# Patient Record
Sex: Male | Born: 1990
Health system: Southern US, Community
[De-identification: ages and names within clinical notes are randomized; demographics above are authoritative.]

## PROBLEM LIST (undated history)

## (undated) DIAGNOSIS — K219 Gastro-esophageal reflux disease without esophagitis: Secondary | ICD-10-CM

---
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
## 2011-04-08 ENCOUNTER — Ambulatory Visit: Payer: Self-pay | Admitting: Internal Medicine

## 2011-04-21 ENCOUNTER — Ambulatory Visit: Payer: Self-pay | Admitting: Internal Medicine

## 2011-08-30 ENCOUNTER — Emergency Department: Payer: Self-pay | Admitting: Emergency Medicine

## 2011-10-31 ENCOUNTER — Ambulatory Visit: Payer: Self-pay | Admitting: Internal Medicine

## 2013-07-09 ENCOUNTER — Emergency Department: Payer: Self-pay | Admitting: Emergency Medicine

## 2013-07-09 LAB — CBC
HCT: 49.6 % (ref 40.0–52.0)
HGB: 16.9 g/dL (ref 13.0–18.0)
MCH: 28.6 pg (ref 26.0–34.0)
MCHC: 34 g/dL (ref 32.0–36.0)
MCV: 84 fL (ref 80–100)
Platelet: 236 10*3/uL (ref 150–440)
RBC: 5.89 10*6/uL (ref 4.40–5.90)
RDW: 12.8 % (ref 11.5–14.5)
WBC: 6.6 10*3/uL (ref 3.8–10.6)

## 2013-07-09 LAB — URINALYSIS, COMPLETE
Bacteria: NONE SEEN
Bilirubin,UR: NEGATIVE
Blood: NEGATIVE
Glucose,UR: NEGATIVE mg/dL (ref 0–75)
Ketone: NEGATIVE
Leukocyte Esterase: NEGATIVE
Nitrite: NEGATIVE
Ph: 5 (ref 4.5–8.0)
Protein: 30
RBC,UR: 1 /HPF (ref 0–5)
Specific Gravity: 1.033 (ref 1.003–1.030)
Squamous Epithelial: 1
WBC UR: 2 /HPF (ref 0–5)

## 2013-07-09 LAB — COMPREHENSIVE METABOLIC PANEL
Albumin: 4.1 g/dL (ref 3.4–5.0)
Alkaline Phosphatase: 81 U/L
Anion Gap: 3 — ABNORMAL LOW (ref 7–16)
BUN: 18 mg/dL (ref 7–18)
Bilirubin,Total: 0.9 mg/dL (ref 0.2–1.0)
Calcium, Total: 9 mg/dL (ref 8.5–10.1)
Chloride: 104 mmol/L (ref 98–107)
Co2: 29 mmol/L (ref 21–32)
Creatinine: 1 mg/dL (ref 0.60–1.30)
EGFR (African American): >60
EGFR (Non-African Amer.): >60
Glucose: 97 mg/dL (ref 65–99)
Osmolality: 274 (ref 275–301)
Potassium: 4.1 mmol/L (ref 3.5–5.1)
SGOT(AST): 22 U/L (ref 15–37)
SGPT (ALT): 19 U/L (ref 12–78)
Sodium: 136 mmol/L (ref 136–145)
Total Protein: 7.5 g/dL (ref 6.4–8.2)

## 2013-07-09 LAB — LIPASE, BLOOD: Lipase: 106 U/L (ref 73–393)

## 2013-07-27 ENCOUNTER — Emergency Department: Payer: Self-pay | Admitting: Internal Medicine

## 2013-11-28 ENCOUNTER — Emergency Department: Payer: Self-pay | Admitting: Emergency Medicine

## 2013-11-30 LAB — BETA STREP CULTURE(ARMC)

## 2014-10-24 ENCOUNTER — Emergency Department
Admission: EM | Admit: 2014-10-24 | Discharge: 2014-10-24 | Disposition: A | Discharge status: Home / Self Care | Payer: Self-pay | Attending: Emergency Medicine | Admitting: Emergency Medicine

## 2014-10-24 ENCOUNTER — Encounter: Payer: Self-pay | Admitting: Medical Oncology

## 2014-10-24 DIAGNOSIS — M62838 Other muscle spasm: Secondary | ICD-10-CM

## 2014-10-24 DIAGNOSIS — M62830 Muscle spasm of back: Secondary | ICD-10-CM | POA: Insufficient documentation

## 2014-10-24 DIAGNOSIS — R079 Chest pain, unspecified: Secondary | ICD-10-CM | POA: Insufficient documentation

## 2014-10-24 LAB — BASIC METABOLIC PANEL
Anion gap: 7 (ref 5–15)
BUN: 12 mg/dL (ref 6–20)
CO2: 30 mmol/L (ref 22–32)
Calcium: 9.1 mg/dL (ref 8.9–10.3)
Chloride: 105 mmol/L (ref 101–111)
Creatinine, Ser: 0.93 mg/dL (ref 0.61–1.24)
GFR calc Af Amer: >60 mL/min (ref >60)
GFR calc non Af Amer: >60 mL/min (ref >60)
Glucose, Bld: 82 mg/dL (ref 65–99)
Potassium: 4 mmol/L (ref 3.5–5.1)
Sodium: 142 mmol/L (ref 135–145)

## 2014-10-24 LAB — CBC
HCT: 46 % (ref 40.0–52.0)
Hemoglobin: 15.3 g/dL (ref 13.0–18.0)
MCH: 27.9 pg (ref 26.0–34.0)
MCHC: 33.3 g/dL (ref 32.0–36.0)
MCV: 83.8 fL (ref 80.0–100.0)
Platelets: 242 10*3/uL (ref 150–440)
RBC: 5.48 MIL/uL (ref 4.40–5.90)
RDW: 12.9 % (ref 11.5–14.5)
WBC: 10.1 10*3/uL (ref 3.8–10.6)

## 2014-10-24 MED ORDER — IBUPROFEN 800 MG PO TABS: 800.0000 mg | ORAL_TABLET | Freq: Three times a day (TID) | ORAL | Status: DC | PRN

## 2014-10-24 MED ORDER — IBUPROFEN 800 MG PO TABS
800.0000 mg | ORAL_TABLET | Freq: Once | ORAL | Status: AC
  Administered 2014-10-24: 800 mg via ORAL

## 2014-10-24 MED ORDER — IBUPROFEN 800 MG PO TABS
ORAL_TABLET | ORAL | Status: AC
  Administered 2014-10-24: 800 mg via ORAL
  Filled 2014-10-24: qty 1

## 2014-10-24 NOTE — ED Provider Notes (Signed)
[facility] Emergency Department Provider Note    ____________________________________________  Time seen: 2020  I have reviewed the triage vital signs and the nursing notes.   HISTORY  Chief Complaint Chest Pain and Back Pain   History limited by: Not Limited   HPI Karl Johnson is a 24 y.o. male resents to the emergency department because of concerns for back and chest pain. The patient states that he started developing some back pain yesterday. Today he also developed some right chest pain. He states the back pain did radiate down into his legs. He denies any recent trauma to his chest or back. Additionally he states he has had slightly similar symptoms in the past but not quite exactly like this. Denies any fevers or shortness of breath.     History reviewed. No pertinent past medical history.  There are no active problems to display for this patient.   History reviewed. No pertinent past surgical history.  No current outpatient prescriptions on file.  Allergies Review of patient's allergies indicates no known allergies.  No family history on file.  Social History History  Substance Use Topics  . Smoking status: Never Smoker   . Smokeless tobacco: Not on file  . Alcohol Use: No    Review of Systems  Constitutional: Negative for fever. Cardiovascular: Right-sided chest pain. Respiratory: Negative for shortness of breath. Gastrointestinal: Negative for abdominal pain, vomiting and diarrhea. Genitourinary: Negative for dysuria. Musculoskeletal: Negative for back pain. Skin: Negative for rash. Neurological: Negative for headaches, focal weakness or numbness.   10-point ROS otherwise negative.  ____________________________________________   PHYSICAL EXAM:  VITAL SIGNS: ED Triage Vitals  Enc Vitals Group     BP 10/24/14 1741 115/88 mmHg     Pulse Rate 10/24/14 1741 93     Resp 10/24/14 1741 18     Temp 10/24/14 1741 98.3 F  (36.8 C)     Temp Source 10/24/14 1741 Oral     SpO2 10/24/14 1741 98 %     Weight 10/24/14 1741 171 lb (77.565 kg)     Height 10/24/14 1741 6\' 2"  (1.88 m)     Head Cir --      Peak Flow --      Pain Score 10/24/14 1741 6   Constitutional: Alert and oriented. Well appearing and in no distress. Eyes: Conjunctivae are normal. PERRL. Normal extraocular movements. ENT   Head: Normocephalic and atraumatic.   Nose: No congestion/rhinnorhea.   Mouth/Throat: Mucous membranes are moist.   Neck: No stridor. Hematological/Lymphatic/Immunilogical: No cervical lymphadenopathy. Cardiovascular: Normal rate, regular rhythm.  No murmurs, rubs, or gallops. Respiratory: Normal respiratory effort without tachypnea nor retractions. Breath sounds are clear and equal bilaterally. No wheezes/rales/rhonchi. Gastrointestinal: Soft and nontender. No distention. There is no CVA tenderness. Genitourinary: Deferred Musculoskeletal: Normal range of motion in all extremities. No joint effusions.  No lower extremity tenderness nor edema. No vertebral tenderness Neurologic:  Normal speech and language. No gross focal neurologic deficits are appreciated. Speech is normal.  Skin:  Skin is warm, dry and intact. No rash noted. Psychiatric: Mood and affect are normal. Speech and behavior are normal. Patient exhibits appropriate insight and judgment.  ____________________________________________    LABS (pertinent positives/negatives)  Labs Reviewed  CBC  BASIC METABOLIC PANEL     ____________________________________________   EKG  EKG Time: 1750 Rate: 90 Rhythm: Normal sinus rhythm Axis: Normal Intervals: QTc 411 QRS: Normal ST changes: No ST elevation    ____________________________________________    RADIOLOGY  None  ____________________________________________   PROCEDURES  Procedure(s) performed: None  Critical Care performed:  No  ____________________________________________   INITIAL IMPRESSION / ASSESSMENT AND PLAN / ED COURSE  Pertinent labs & imaging results that were available during my care of the patient were reviewed by me and considered in my medical decision making (see chart for details).  Patient here with some back and chest pain. Physical exam is very reassuring. No respiratory distress. Breath sounds equal clear bilaterally. Basic blood work without any concerning findings, additionally EKG without any concerning findings. Think this likely represents some chest wall inflammation or muscle spasms. I'll give patient high-dose ibuprofen.  ____________________________________________   FINAL CLINICAL IMPRESSION(S) / ED DIAGNOSES  Final diagnoses:  Muscle spasm     Phineas SemenGraydon Kin Galbraith, MD 10/24/14 2129

## 2014-10-24 NOTE — ED Notes (Signed)
Pt reports that he began having rt lower back pain that radiates into leg. Today while at work began having rt sided chest pain.

## 2014-10-24 NOTE — Discharge Instructions (Signed)
Please seek medical attention for any high fevers, chest pain, shortness of breath, change in behavior, persistent vomiting, bloody stool or any other new or concerning symptoms.  Muscle Cramps and Spasms Muscle cramps and spasms are when muscles tighten by themselves. They usually get better within minutes. Muscle cramps are painful. They are usually stronger and last longer than muscle spasms. Muscle spasms may or may not be painful. They can last a few seconds or much longer. HOME CARE  Drink enough fluid to keep your pee (urine) clear or pale yellow.  Massage, stretch, and relax the muscle.  Use a warm towel, heating pad, or warm shower water on tight muscles.  Place ice on the muscle if it is tender or in pain.  Put ice in a plastic bag.  Place a towel between your skin and the bag.  Leave the ice on for 15-20 minutes, 03-04 times a day.  Only take medicine as told by your doctor. GET HELP RIGHT AWAY IF:  Your cramps or spasms get worse, happen more often, or do not get better with time. MAKE SURE YOU:  Understand these instructions.  Will watch your condition.  Will get help right away if you are not doing well or get worse. Document Released: 05/15/2008 Document Revised: 09/27/2012 Document Reviewed: 05/19/2012 [facility] Patient Information 2015 [employer] [place]. This information is not intended to replace advice given to you by your health care provider. Make sure you discuss any questions you have with your health care provider.

## 2015-03-08 ENCOUNTER — Encounter: Payer: Self-pay | Admitting: Emergency Medicine

## 2015-03-08 ENCOUNTER — Emergency Department
Admission: EM | Admit: 2015-03-08 | Discharge: 2015-03-08 | Disposition: A | Discharge status: Home / Self Care | Payer: PRIVATE HEALTH INSURANCE | Attending: Emergency Medicine | Admitting: Emergency Medicine

## 2015-03-08 DIAGNOSIS — B349 Viral infection, unspecified: Secondary | ICD-10-CM | POA: Diagnosis not present

## 2015-03-08 DIAGNOSIS — Z79899 Other long term (current) drug therapy: Secondary | ICD-10-CM | POA: Diagnosis not present

## 2015-03-08 DIAGNOSIS — M791 Myalgia: Secondary | ICD-10-CM | POA: Diagnosis present

## 2015-03-08 LAB — CBC WITH DIFFERENTIAL/PLATELET
Basophils Absolute: 0 10*3/uL (ref 0–0.1)
Basophils Relative: 0 %
Eosinophils Absolute: 0.4 10*3/uL (ref 0–0.7)
Eosinophils Relative: 4 %
HCT: 50.2 % (ref 40.0–52.0)
Hemoglobin: 16.5 g/dL (ref 13.0–18.0)
Lymphocytes Relative: 16 %
Lymphs Abs: 1.5 10*3/uL (ref 1.0–3.6)
MCH: 27.6 pg (ref 26.0–34.0)
MCHC: 32.8 g/dL (ref 32.0–36.0)
MCV: 84.2 fL (ref 80.0–100.0)
Monocytes Absolute: 0.6 10*3/uL (ref 0.2–1.0)
Monocytes Relative: 7 %
Neutro Abs: 6.9 10*3/uL — ABNORMAL HIGH (ref 1.4–6.5)
Neutrophils Relative %: 73 %
Platelets: 252 10*3/uL (ref 150–440)
RBC: 5.96 MIL/uL — ABNORMAL HIGH (ref 4.40–5.90)
RDW: 13.6 % (ref 11.5–14.5)
WBC: 9.5 10*3/uL (ref 3.8–10.6)

## 2015-03-08 LAB — COMPREHENSIVE METABOLIC PANEL
ALT: 31 U/L (ref 17–63)
AST: 23 U/L (ref 15–41)
Albumin: 4.2 g/dL (ref 3.5–5.0)
Alkaline Phosphatase: 88 U/L (ref 38–126)
Anion gap: 6 (ref 5–15)
BUN: 12 mg/dL (ref 6–20)
CO2: 30 mmol/L (ref 22–32)
Calcium: 9.3 mg/dL (ref 8.9–10.3)
Chloride: 104 mmol/L (ref 101–111)
Creatinine, Ser: 0.84 mg/dL (ref 0.61–1.24)
GFR calc Af Amer: >60 mL/min (ref >60)
GFR calc non Af Amer: >60 mL/min (ref >60)
Glucose, Bld: 115 mg/dL — ABNORMAL HIGH (ref 65–99)
Potassium: 4.7 mmol/L (ref 3.5–5.1)
Sodium: 140 mmol/L (ref 135–145)
Total Bilirubin: 0.8 mg/dL (ref 0.3–1.2)
Total Protein: 7.5 g/dL (ref 6.5–8.1)

## 2015-03-08 LAB — URINALYSIS COMPLETE WITH MICROSCOPIC (ARMC ONLY)
Bacteria, UA: NONE SEEN
Bilirubin Urine: NEGATIVE
Glucose, UA: NEGATIVE mg/dL
Hgb urine dipstick: NEGATIVE
Ketones, ur: NEGATIVE mg/dL
Leukocytes, UA: NEGATIVE
Nitrite: NEGATIVE
Protein, ur: NEGATIVE mg/dL
Specific Gravity, Urine: 1.02 (ref 1.005–1.030)
pH: 5 (ref 5.0–8.0)

## 2015-03-08 MED ORDER — SODIUM CHLORIDE 0.9 % IV BOLUS (SEPSIS)
1000.0000 mL | Freq: Once | INTRAVENOUS | Status: AC
  Administered 2015-03-08: 1000 mL via INTRAVENOUS

## 2015-03-08 NOTE — ED Provider Notes (Signed)
[facility] Emergency Department [place] Note ____________________________________________  Time seen: Approximately 8:16 AM  I have reviewed the triage vital signs and the nursing notes.   HISTORY  Chief Complaint Generalized Body Aches   HPI Karl Johnson is a 24 y.o. male is here with complaint of sore throat, body aches, fever and chills for 3 days. She has a nonproductive cough and nasal congestion that does not keep her up at night. She has been taking some Tylenol cold relief without any improvement. She is unaware of any actual temperature but just felt like she was running fever. She denies being a smoker in the past. She denies any previous bronchitis or pneumonia. Pain is 6/10 at present.  History reviewed. No pertinent past medical history.  There are no active problems to display for this patient.   No past surgical history on file.  Current Outpatient Rx  Name  Route  Sig  Dispense  Refill  . fluticasone (FLONASE) 50 MCG/ACT nasal spray   Each Nare   Place 2 sprays into both nostrils daily.         Marland Kitchen ibuprofen (ADVIL,MOTRIN) 800 MG tablet   Oral   Take 1 tablet (800 mg total) by mouth every 8 (eight) hours as needed.   30 tablet   0     Allergies Review of patient's allergies indicates no known allergies.  No family history on file.  Social History Social History  Substance Use Topics  . Smoking status: Never Smoker   . Smokeless tobacco: None  . Alcohol Use: No    Review of Systems Constitutional: Positive fever/chills Eyes: No visual changes. ENT: Positive sore throat. Cardiovascular: Denies chest pain. Respiratory: Denies shortness of breath. Gastrointestinal: No abdominal pain.  No nausea, no vomiting.  No diarrhea.  No constipation. Genitourinary: Negative for dysuria. Musculoskeletal: Negative for back pain. Skin: Negative for rash. Neurological: Negative for headaches, focal weakness or numbness.  10-point  ROS otherwise negative.  ____________________________________________   PHYSICAL EXAM:  VITAL SIGNS: ED Triage Vitals  Enc Vitals Group     BP 03/08/15 0800 122/77 mmHg     Pulse Rate 03/08/15 0800 78     Resp 03/08/15 0800 14     Temp 03/08/15 0800 97.9 F (36.6 C)     Temp Source 03/08/15 0800 Oral     SpO2 03/08/15 0800 98 %     Weight 03/08/15 0800 171 lb (77.565 kg)     Height 03/08/15 0800  (1.854 m)     Head Cir --      Peak Flow --      Pain Score 03/08/15 0801 6     Pain Loc --      Pain Edu? --      Excl. in GC? --     Constitutional: Alert and oriented. Well appearing and in no acute distress. Eyes: Conjunctivae are normal. PERRL. EOMI. Head: Atraumatic. Nose: Positive congestion/rhinnorhea.  EACs and TMs clear bilaterally Mouth/Throat: Mucous membranes are moist.  Oropharynx non-erythematous. Moderate posterior drainage was seen. No exudate noted. Neck: No stridor.   Hematological/Lymphatic/Immunilogical: No cervical lymphadenopathy. Cardiovascular: Normal rate, regular rhythm. Grossly normal heart sounds.  Good peripheral circulation. Respiratory: Normal respiratory effort.  No retractions. Lungs CTAB. Gastrointestinal: Soft and nontender. No distention. No abdominal bruits. No CVA tenderness. Musculoskeletal: No lower extremity tenderness nor edema.  No joint effusions. Neurologic:  Normal speech and language. No gross focal neurologic deficits are appreciated. No gait instability. Skin:  Skin is warm, dry and intact. No rash noted. Psychiatric: Mood and affect are normal. Speech and behavior are normal.  ____________________________________________   LABS (all labs ordered are listed, but only abnormal results are displayed)  Labs Reviewed  URINALYSIS COMPLETEWITH MICROSCOPIC ([facility] ONLY) - Abnormal; Notable for the following:    Color, Urine YELLOW (*)    APPearance CLEAR (*)    Squamous Epithelial / LPF 0-5 (*)    All other components within  normal limits  COMPREHENSIVE METABOLIC PANEL - Abnormal; Notable for the following:    Glucose, Bld 115 (*)    All other components within normal limits  CBC WITH DIFFERENTIAL/PLATELET - Abnormal; Notable for the following:    RBC 5.96 (*)    Neutro Abs 6.9 (*)    All other components within normal limits    PROCEDURES  Procedure(s) performed: None  Critical Care performed: No  ____________________________________________   INITIAL IMPRESSION / ASSESSMENT AND PLAN / ED COURSE  Pertinent labs & imaging results that were available during my care of the patient were reviewed by me and considered in my medical decision making (see chart for details).  Patient was placed on Claritin-D twice a day and Tessalon Perles as needed for cough. Patient is use Tylenol or ibuprofen if needed for fever or chills. She is also to increase fluids. ____________________________________________   FINAL CLINICAL IMPRESSION(S) / ED DIAGNOSES  Final diagnoses:  Viral illness      Tommi Rumps, PA-C 03/08/15 1050  Tommi Rumps, PA-C 03/08/15 1051  Phineas Semen, MD 03/08/15 1252

## 2015-03-08 NOTE — ED Notes (Signed)
Sore throat  Body aches with fever and chills for 3 days

## 2015-03-08 NOTE — ED Notes (Signed)
Says feels bad for 3 days now.  Says his sinuses usually act uip after rain.  But he is still having body aches and hot and cold chills.

## 2015-06-19 ENCOUNTER — Emergency Department
Admission: EM | Admit: 2015-06-19 | Discharge: 2015-06-19 | Disposition: A | Discharge status: Home / Self Care | Payer: PRIVATE HEALTH INSURANCE | Attending: Emergency Medicine | Admitting: Emergency Medicine

## 2015-06-19 DIAGNOSIS — R0981 Nasal congestion: Secondary | ICD-10-CM | POA: Diagnosis present

## 2015-06-19 DIAGNOSIS — J069 Acute upper respiratory infection, unspecified: Secondary | ICD-10-CM | POA: Insufficient documentation

## 2015-06-19 DIAGNOSIS — Z79899 Other long term (current) drug therapy: Secondary | ICD-10-CM | POA: Insufficient documentation

## 2015-06-19 MED ORDER — PSEUDOEPHEDRINE HCL 60 MG PO TABS: 60.0000 mg | ORAL_TABLET | ORAL | Status: DC | PRN

## 2015-06-19 MED ORDER — AZITHROMYCIN 250 MG PO TABS: ORAL_TABLET | ORAL | Status: DC

## 2015-06-19 MED ORDER — GUAIFENESIN-CODEINE 100-10 MG/5ML PO SOLN: 10.0000 mL | ORAL | Status: DC | PRN

## 2015-06-19 MED ORDER — CHLORPHENIRAMINE MALEATE 4 MG PO TABS: 4.0000 mg | ORAL_TABLET | Freq: Two times a day (BID) | ORAL | Status: DC | PRN

## 2015-06-19 NOTE — Discharge Instructions (Signed)
Upper Respiratory Infection, Adult Most upper respiratory infections (URIs) are a viral infection of the air passages leading to the lungs. A URI affects the nose, throat, and upper air passages. The most common type of URI is nasopharyngitis and is typically referred to as "the common cold." URIs run their course and usually go away on their own. Most of the time, a URI does not require medical attention, but sometimes a bacterial infection in the upper airways can follow a viral infection. This is called a secondary infection. Sinus and middle ear infections are common types of secondary upper respiratory infections. Bacterial pneumonia can also complicate a URI. A URI can worsen asthma and chronic obstructive pulmonary disease (COPD). Sometimes, these complications can require emergency medical care and may be life threatening.  CAUSES Almost all URIs are caused by viruses. A virus is a type of germ and can spread from one person to another.  RISKS FACTORS You may be at risk for a URI if:   You smoke.   You have chronic heart or lung disease.  You have a weakened defense (immune) system.   You are very young or very old.   You have nasal allergies or asthma.  You work in crowded or poorly ventilated areas.  You work in health care facilities or schools. SIGNS AND SYMPTOMS  Symptoms typically develop 2-3 days after you come in contact with a cold virus. Most viral URIs last 7-10 days. However, viral URIs from the influenza virus (flu virus) can last 14-18 days and are typically more severe. Symptoms may include:   Runny or stuffy (congested) nose.   Sneezing.   Cough.   Sore throat.   Headache.   Fatigue.   Fever.   Loss of appetite.   Pain in your forehead, behind your eyes, and over your cheekbones (sinus pain).  Muscle aches.  DIAGNOSIS  Your health care provider may diagnose a URI by:  Physical exam.  Tests to check that your symptoms are not due to  another condition such as:  Strep throat.  Sinusitis.  Pneumonia.  Asthma. TREATMENT  A URI goes away on its own with time. It cannot be cured with medicines, but medicines may be prescribed or recommended to relieve symptoms. Medicines may help:  Reduce your fever.  Reduce your cough.  Relieve nasal congestion. HOME CARE INSTRUCTIONS   Take medicines only as directed by your health care provider.   Gargle warm saltwater or take cough drops to comfort your throat as directed by your health care provider.  Use a warm mist humidifier or inhale steam from a shower to increase air moisture. This may make it easier to breathe.  Drink enough fluid to keep your urine clear or pale yellow.   Eat soups and other clear broths and maintain good nutrition.   Rest as needed.   Return to work when your temperature has returned to normal or as your health care provider advises. You may need to stay home longer to avoid infecting others. You can also use a face mask and careful hand washing to prevent spread of the virus.  Increase the usage of your inhaler if you have asthma.   Do not use any tobacco products, including cigarettes, chewing tobacco, or electronic cigarettes. If you need help quitting, ask your health care provider. PREVENTION  The best way to protect yourself from getting a cold is to practice good hygiene.   Avoid oral or hand contact with people with cold   symptoms.   Wash your hands often if contact occurs.  There is no clear evidence that vitamin C, vitamin E, echinacea, or exercise reduces the chance of developing a cold. However, it is always recommended to get plenty of rest, exercise, and practice good nutrition.  SEEK MEDICAL CARE IF:   You are getting worse rather than better.   Your symptoms are not controlled by medicine.   You have chills.  You have worsening shortness of breath.  You have brown or red mucus.  You have yellow or brown nasal  discharge.  You have pain in your face, especially when you bend forward.  You have a fever.  You have swollen neck glands.  You have pain while swallowing.  You have white areas in the back of your throat. SEEK IMMEDIATE MEDICAL CARE IF:   You have severe or persistent:  Headache.  Ear pain.  Sinus pain.  Chest pain.  You have chronic lung disease and any of the following:  Wheezing.  Prolonged cough.  Coughing up blood.  A change in your usual mucus.  You have a stiff neck.  You have changes in your:  Vision.  Hearing.  Thinking.  Mood. MAKE SURE YOU:   Understand these instructions.  Will watch your condition.  Will get help right away if you are not doing well or get worse.   This information is not intended to replace advice given to you by your health care provider. Make sure you discuss any questions you have with your health care provider.   Document Released: 11/26/2000 Document Revised: 10/17/2014 Document Reviewed: 09/07/2013 Elsevier Interactive Patient Education 2016 [employer] Inc.  

## 2015-06-19 NOTE — ED Provider Notes (Signed)
[facility] Emergency Department Provider Note  ____________________________________________  Time seen: Approximately 12:55 PM  I have reviewed the triage vital signs and the nursing notes.   HISTORY  Chief Complaint Nasal Congestion    HPI Karl Johnson is a 25 y.o. male presents with a one-week history of sore throat congestion cough. Patient states that he spent and low-grade fevers. Positive for runny nose and drainage.   History reviewed. No pertinent past medical history.  There are no active problems to display for this patient.   History reviewed. No pertinent past surgical history.  Current Outpatient Rx  Name  Route  Sig  Dispense  Refill  . azithromycin (ZITHROMAX Z-PAK) 250 MG tablet      Take 2 tablets (500 mg) on  Day 1,  followed by 1 tablet (250 mg) once daily on Days 2 through 5.   6 each   0   . chlorpheniramine (CHLOR-TRIMETON) 4 MG tablet   Oral   Take 1 tablet (4 mg total) by mouth 2 (two) times daily as needed for allergies or rhinitis.   30 tablet   0   . fluticasone (FLONASE) 50 MCG/ACT nasal spray   Each Nare   Place 2 sprays into both nostrils daily.         Marland Kitchen. guaiFENesin-codeine 100-10 MG/5ML syrup   Oral   Take 10 mLs by mouth every 4 (four) hours as needed for cough.   180 mL   0   . ibuprofen (ADVIL,MOTRIN) 800 MG tablet   Oral   Take 1 tablet (800 mg total) by mouth every 8 (eight) hours as needed.   30 tablet   0   . pseudoephedrine (SUDAFED) 60 MG tablet   Oral   Take 1 tablet (60 mg total) by mouth every 4 (four) hours as needed for congestion.   24 tablet   0     Allergies Review of patient's allergies indicates no known allergies.  No family history on file.  Social History Social History  Substance Use Topics  . Smoking status: Never Smoker   . Smokeless tobacco: None  . Alcohol Use: No    Review of Systems Constitutional: Occasional fever/chills Eyes: No visual  changes. ENT: Positive sore throat. Cardiovascular: Denies chest pain. Respiratory: Denies shortness of breath. Positive for cough. Gastrointestinal: No abdominal pain.  No nausea, no vomiting.  No diarrhea.  No constipation. Genitourinary: Negative for dysuria. Musculoskeletal: Negative for back pain. Skin: Negative for rash. Neurological: Negative for headaches, focal weakness or numbness.  10-point ROS otherwise negative.  ____________________________________________   PHYSICAL EXAM:  VITAL SIGNS: ED Triage Vitals  Enc Vitals Group     BP 06/19/15 1235 124/80 mmHg     Pulse Rate 06/19/15 1235 90     Resp 06/19/15 1235 20     Temp 06/19/15 1235 98.7 F (37.1 C)     Temp Source 06/19/15 1235 Oral     SpO2 06/19/15 1235 96 %     Weight 06/19/15 1235 180 lb (81.647 kg)     Height 06/19/15 1235 6\' 1"  (1.854 m)     Head Cir --      Peak Flow --      Pain Score 06/19/15 1244 4     Pain Loc --      Pain Edu? --      Excl. in GC? --     Constitutional: Alert and oriented. Well appearing and in no acute distress. Eyes: Conjunctivae  are normal. PERRL. EOMI. Head: Atraumatic. Nose: Positive congestion/rhinnorhea with swollen nasal turbinates. Mouth/Throat: Mucous membranes are moist.  Oropharynx non-erythematous. Neck: No stridor.   Cardiovascular: Normal rate, regular rhythm. Grossly normal heart sounds.  Good peripheral circulation. Respiratory: Normal respiratory effort.  No retractions. Lungs CTAB. Gastrointestinal: Soft and nontender. No distention. No abdominal bruits. No CVA tenderness. Musculoskeletal: No lower extremity tenderness nor edema.  No joint effusions. Neurologic:  Normal speech and language. No gross focal neurologic deficits are appreciated. No gait instability. Skin:  Skin is warm, dry and intact. No rash noted. Psychiatric: Mood and affect are normal. Speech and behavior are normal.  ____________________________________________   LABS (all labs  ordered are listed, but only abnormal results are displayed)  Labs Reviewed - No data to display ____________________________________________    PROCEDURES  Procedure(s) performed: None  Critical Care performed: No  ____________________________________________   INITIAL IMPRESSION / ASSESSMENT AND PLAN / ED COURSE  Pertinent labs & imaging results that were available during my care of the patient were reviewed by me and considered in my medical decision making (see chart for details).  Acute upper respiratory infection. Rx given for Zithromax, Sudafed, chlorpheniramine, and Robitussin-AC. Patient to follow up with PCP or return to the ER with any worsening symptomology. Patient voices no other emergency medical complaints at this visit. ____________________________________________   FINAL CLINICAL IMPRESSION(S) / ED DIAGNOSES  Final diagnoses:  URI, acute      Evangeline Dakin, PA-C 06/19/15 1322  Sharman Cheek, MD 06/19/15 [phone]

## 2015-06-19 NOTE — ED Notes (Signed)
Pt reports to ED for sore throat, congestion and cough that has been going on since last Tuesday.  NAD.

## 2015-08-27 ENCOUNTER — Emergency Department
Admission: EM | Admit: 2015-08-27 | Discharge: 2015-08-27 | Disposition: A | Discharge status: Home / Self Care | Payer: PRIVATE HEALTH INSURANCE | Attending: Emergency Medicine | Admitting: Emergency Medicine

## 2015-08-27 ENCOUNTER — Encounter: Payer: Self-pay | Admitting: Emergency Medicine

## 2015-08-27 DIAGNOSIS — R0989 Other specified symptoms and signs involving the circulatory and respiratory systems: Secondary | ICD-10-CM | POA: Insufficient documentation

## 2015-08-27 DIAGNOSIS — R110 Nausea: Secondary | ICD-10-CM | POA: Insufficient documentation

## 2015-08-27 DIAGNOSIS — R6889 Other general symptoms and signs: Secondary | ICD-10-CM

## 2015-08-27 DIAGNOSIS — Z7951 Long term (current) use of inhaled steroids: Secondary | ICD-10-CM | POA: Insufficient documentation

## 2015-08-27 DIAGNOSIS — R5381 Other malaise: Secondary | ICD-10-CM | POA: Insufficient documentation

## 2015-08-27 DIAGNOSIS — R05 Cough: Secondary | ICD-10-CM | POA: Insufficient documentation

## 2015-08-27 DIAGNOSIS — R0981 Nasal congestion: Secondary | ICD-10-CM | POA: Insufficient documentation

## 2015-08-27 DIAGNOSIS — R509 Fever, unspecified: Secondary | ICD-10-CM | POA: Insufficient documentation

## 2015-08-27 LAB — RAPID INFLUENZA A&B ANTIGENS
Influenza A (ARMC): NEGATIVE
Influenza B (ARMC): NEGATIVE

## 2015-08-27 MED ORDER — KETOROLAC TROMETHAMINE 30 MG/ML IJ SOLN
INTRAMUSCULAR | Status: AC
  Filled 2015-08-27: qty 1

## 2015-08-27 MED ORDER — PSEUDOEPH-BROMPHEN-DM 30-2-10 MG/5ML PO SYRP: 5.0000 mL | ORAL_SOLUTION | Freq: Four times a day (QID) | ORAL | Status: DC | PRN

## 2015-08-27 MED ORDER — KETOROLAC TROMETHAMINE 60 MG/2ML IM SOLN
30.0000 mg | Freq: Once | INTRAMUSCULAR | Status: AC
  Administered 2015-08-27: 30 mg via INTRAMUSCULAR

## 2015-08-27 MED ORDER — IBUPROFEN 800 MG PO TABS: 800.0000 mg | ORAL_TABLET | Freq: Three times a day (TID) | ORAL | Status: DC | PRN

## 2015-08-27 NOTE — ED Notes (Signed)
Pt reports started with cough last night.  Woke up today with body aches, chills, runny nose, and congestion.  Reports cough is dry. Wife has been dx with flu one week ago.

## 2015-08-27 NOTE — ED Provider Notes (Signed)
[facility] Emergency Department Provider Note  ____________________________________________  Time seen: Approximately 12:55 PM  I have reviewed the triage vital signs and the nursing notes.   HISTORY  Chief Complaint Cough    HPI Karl Johnson is a 25 y.o. male patient's stay was bit last night with nonproductive cough. Patient also complaining of body aches and pains. Patient also having nasal congestion intermittently runny nose.He stated there is nausea but no vomiting diarrhea. Patient states his wife had the flu last week. Patient and wife has taken a flu immunizations this season. No palliative measures taken for this complaint. States rates his pain discomfort as a 6/10. Patient describes his pain discomfort as "achy".   History reviewed. No pertinent past medical history.  There are no active problems to display for this patient.   History reviewed. No pertinent past surgical history.  Current Outpatient Rx  Name  Route  Sig  Dispense  Refill  . azithromycin (ZITHROMAX Z-PAK) 250 MG tablet      Take 2 tablets (500 mg) on  Day 1,  followed by 1 tablet (250 mg) once daily on Days 2 through 5.   6 each   0   . brompheniramine-pseudoephedrine-DM 30-2-10 MG/5ML syrup   Oral   Take 5 mLs by mouth 4 (four) times daily as needed.   120 mL   0   . chlorpheniramine (CHLOR-TRIMETON) 4 MG tablet   Oral   Take 1 tablet (4 mg total) by mouth 2 (two) times daily as needed for allergies or rhinitis.   30 tablet   0   . fluticasone (FLONASE) 50 MCG/ACT nasal spray   Each Nare   Place 2 sprays into both nostrils daily.         Marland Kitchen. guaiFENesin-codeine 100-10 MG/5ML syrup   Oral   Take 10 mLs by mouth every 4 (four) hours as needed for cough.   180 mL   0   . ibuprofen (ADVIL,MOTRIN) 800 MG tablet   Oral   Take 1 tablet (800 mg total) by mouth every 8 (eight) hours as needed.   30 tablet   0   . ibuprofen (ADVIL,MOTRIN) 800 MG tablet    Oral   Take 1 tablet (800 mg total) by mouth every 8 (eight) hours as needed for moderate pain.   15 tablet   0   . pseudoephedrine (SUDAFED) 60 MG tablet   Oral   Take 1 tablet (60 mg total) by mouth every 4 (four) hours as needed for congestion.   24 tablet   0     Allergies Review of patient's allergies indicates no known allergies.  History reviewed. No pertinent family history.  Social History Social History  Substance Use Topics  . Smoking status: Never Smoker   . Smokeless tobacco: None  . Alcohol Use: No    Review of Systems Constitutional: Fever and chills. Bodyache and malaise Eyes: No visual changes.ENT: Sore throat Cardiovascular: Denies chest pain. Respiratory: Denies shortness of breath. Nonproductive cough Gastrointestinal: No abdominal pain.   nausea, no vomiting.  No diarrhea.  No constipation. Genitourinary: Negative for dysuria. Musculoskeletal: Negative for back pain. Skin: Negative for rash. Neurological: Negative for headaches, focal weakness or numbness.    ____________________________________________   PHYSICAL EXAM:  VITAL SIGNS: ED Triage Vitals  Enc Vitals Group     BP 08/27/15 1153 137/68 mmHg     Pulse Rate 08/27/15 1152 74     Resp 08/27/15 1152 17  Temp 08/27/15 1152 98.2 F (36.8 C)     Temp Source 08/27/15 1152 Oral     SpO2 08/27/15 1152 98 %     Weight 08/27/15 1152 180 lb (81.647 kg)     Height 08/27/15 1152  (1.88 m)     Head Cir --      Peak Flow --      Pain Score 08/27/15 1153 6     Pain Loc --      Pain Edu? --      Excl. in GC? --     Constitutional: Alert and oriented. Well appearing and in no acute distress. Eyes: Conjunctivae are normal. PERRL. EOMI. Head: Atraumatic. Nose: No congestion/rhinnorhea. Mouth/Throat: Mucous membranes are moist.  Oropharynx non-erythematous. Neck: No stridor.  No cervical spine tenderness to palpation. Hematological/Lymphatic/Immunilogical: No cervical  lymphadenopathy. Cardiovascular: Normal rate, regular rhythm. Grossly normal heart sounds.  Good peripheral circulation. Respiratory: Normal respiratory effort.  No retractions. Lungs CTAB. Gastrointestinal: Soft and nontender. No distention. No abdominal bruits. No CVA tenderness. Musculoskeletal: No lower extremity tenderness nor edema.  No joint effusions. Neurologic:  Normal speech and language. No gross focal neurologic deficits are appreciated. No gait instability. Skin:  Skin is warm, dry and intact. No rash noted. Psychiatric: Mood and affect are normal. Speech and behavior are normal.  ____________________________________________   LABS (all labs ordered are listed, but only abnormal results are displayed)  Labs Reviewed  RAPID INFLUENZA A&B ANTIGENS ([facility] ONLY)   ____________________________________________  EKG   ____________________________________________  RADIOLOGY   ____________________________________________   PROCEDURES  Procedure(s) performed: None  Critical Care performed: No  ____________________________________________   INITIAL IMPRESSION / ASSESSMENT AND PLAN / ED COURSE  Pertinent labs & imaging results that were available during my care of the patient were reviewed by me and considered in my medical decision making (see chart for details).  Viral illness. Discussed negative rapid strep test the patient. Patient given Toradol 30 mg in the ER for body aches. Given discharge Instructions prescriptions for Broan felt DM. Advised over-the-counter ibuprofen for fever and myalgia. ____________________________________________   FINAL CLINICAL IMPRESSION(S) / ED DIAGNOSES  Final diagnoses:  Flu-like symptoms      Joni Reining, PA-C 08/27/15 2126  Minna Antis, MD 08/28/15 [phone]

## 2015-08-27 NOTE — ED Notes (Signed)
See triage note  Cough last pm  But woke up with body aches this am  Afebrile on arrival  Cough is non prod at present

## 2016-04-24 ENCOUNTER — Encounter: Payer: Self-pay | Admitting: Emergency Medicine

## 2016-04-24 ENCOUNTER — Emergency Department
Admission: EM | Admit: 2016-04-24 | Discharge: 2016-04-24 | Disposition: A | Discharge status: Home / Self Care | Payer: PRIVATE HEALTH INSURANCE | Attending: Emergency Medicine | Admitting: Emergency Medicine

## 2016-04-24 DIAGNOSIS — B349 Viral infection, unspecified: Secondary | ICD-10-CM | POA: Insufficient documentation

## 2016-04-24 DIAGNOSIS — E860 Dehydration: Secondary | ICD-10-CM | POA: Insufficient documentation

## 2016-04-24 DIAGNOSIS — R1110 Vomiting, unspecified: Secondary | ICD-10-CM

## 2016-04-24 DIAGNOSIS — Z79899 Other long term (current) drug therapy: Secondary | ICD-10-CM | POA: Insufficient documentation

## 2016-04-24 DIAGNOSIS — K529 Noninfective gastroenteritis and colitis, unspecified: Secondary | ICD-10-CM | POA: Insufficient documentation

## 2016-04-24 DIAGNOSIS — Z87891 Personal history of nicotine dependence: Secondary | ICD-10-CM | POA: Insufficient documentation

## 2016-04-24 DIAGNOSIS — Z791 Long term (current) use of non-steroidal anti-inflammatories (NSAID): Secondary | ICD-10-CM | POA: Insufficient documentation

## 2016-04-24 DIAGNOSIS — R197 Diarrhea, unspecified: Secondary | ICD-10-CM

## 2016-04-24 LAB — URINALYSIS COMPLETE WITH MICROSCOPIC (ARMC ONLY)
Bacteria, UA: NONE SEEN
Bilirubin Urine: NEGATIVE
Glucose, UA: NEGATIVE mg/dL
Hgb urine dipstick: NEGATIVE
Leukocytes, UA: NEGATIVE
Nitrite: NEGATIVE
Protein, ur: 30 mg/dL — AB
Specific Gravity, Urine: 1.033 — ABNORMAL HIGH (ref 1.005–1.030)
pH: 5 (ref 5.0–8.0)

## 2016-04-24 LAB — CBC
HCT: 46.5 % (ref 40.0–52.0)
Hemoglobin: 16.1 g/dL (ref 13.0–18.0)
MCH: 28.5 pg (ref 26.0–34.0)
MCHC: 34.7 g/dL (ref 32.0–36.0)
MCV: 82.3 fL (ref 80.0–100.0)
Platelets: 291 10*3/uL (ref 150–440)
RBC: 5.65 MIL/uL (ref 4.40–5.90)
RDW: 12.8 % (ref 11.5–14.5)
WBC: 9 10*3/uL (ref 3.8–10.6)

## 2016-04-24 LAB — COMPREHENSIVE METABOLIC PANEL
ALT: 19 U/L (ref 17–63)
AST: 18 U/L (ref 15–41)
Albumin: 4.7 g/dL (ref 3.5–5.0)
Alkaline Phosphatase: 72 U/L (ref 38–126)
Anion gap: 10 (ref 5–15)
BUN: 14 mg/dL (ref 6–20)
CO2: 24 mmol/L (ref 22–32)
Calcium: 9.3 mg/dL (ref 8.9–10.3)
Chloride: 104 mmol/L (ref 101–111)
Creatinine, Ser: 0.96 mg/dL (ref 0.61–1.24)
GFR calc Af Amer: >60 mL/min (ref >60)
GFR calc non Af Amer: >60 mL/min (ref >60)
Glucose, Bld: 92 mg/dL (ref 65–99)
Potassium: 4.3 mmol/L (ref 3.5–5.1)
Sodium: 138 mmol/L (ref 135–145)
Total Bilirubin: 1.1 mg/dL (ref 0.3–1.2)
Total Protein: 7.8 g/dL (ref 6.5–8.1)

## 2016-04-24 LAB — LIPASE, BLOOD: Lipase: 31 U/L (ref 11–51)

## 2016-04-24 MED ORDER — IBUPROFEN 600 MG PO TABS: 600.0000 mg | ORAL_TABLET | Freq: Three times a day (TID) | ORAL | 0 refills | Status: DC | PRN

## 2016-04-24 MED ORDER — ONDANSETRON HCL 4 MG/2ML IJ SOLN
4.0000 mg | Freq: Once | INTRAMUSCULAR | Status: AC | PRN
  Administered 2016-04-24: 4 mg via INTRAVENOUS
  Filled 2016-04-24: qty 2

## 2016-04-24 MED ORDER — DIPHENOXYLATE-ATROPINE 2.5-0.025 MG PO TABS: 1.0000 | ORAL_TABLET | Freq: Four times a day (QID) | ORAL | 0 refills | Status: AC | PRN

## 2016-04-24 MED ORDER — ONDANSETRON HCL 8 MG PO TABS: 12.0000 mg | ORAL_TABLET | Freq: Three times a day (TID) | ORAL | 0 refills | Status: DC | PRN

## 2016-04-24 MED ORDER — SODIUM CHLORIDE 0.9 % IV BOLUS (SEPSIS)
1000.0000 mL | Freq: Once | INTRAVENOUS | Status: AC
  Administered 2016-04-24: 1000 mL via INTRAVENOUS

## 2016-04-24 NOTE — ED Triage Notes (Signed)
Fever , N/V x1 day , temp at home 101.5 with advil taken PTA.  No signs of distress.

## 2016-04-24 NOTE — ED Provider Notes (Signed)
[facility] Emergency Department Provider Note   ____________________________________________   First MD Initiated Contact with Patient 04/24/16 1204     (approximate)  I have reviewed the triage vital signs and the nursing notes.   HISTORY  Chief Complaint Fever    HPI Karl Johnson is a 25 y.o. male patient complaining of fever, vertigo, vomiting, and diarrhea. Onset of complaints yesterday. Patient states temperature home was 101.5 today taken Aleve prior to arrival. Patient states he gets dizzy when moving from a sitting to standing position. Patient state vertigo is transit. Patient denies any vision disturbance. Patient denies any weakness. Patient is taken a flu shot greater than 1 month ago. Patient denies nasal congestion or cough.   History reviewed. No pertinent past medical history.  There are no active problems to display for this patient.   History reviewed. No pertinent surgical history.  Prior to Admission medications   Medication Sig Start Date End Date Taking? Authorizing Provider  azithromycin (ZITHROMAX Z-PAK) 250 MG tablet Take 2 tablets (500 mg) on  Day 1,  followed by 1 tablet (250 mg) once daily on Days 2 through 5. 06/19/15   Evangeline Dakinharles M Beers, PA-C  brompheniramine-pseudoephedrine-DM 30-2-10 MG/5ML syrup Take 5 mLs by mouth 4 (four) times daily as needed. 08/27/15   Joni Reiningonald K Krisy Dix, PA-C  chlorpheniramine (CHLOR-TRIMETON) 4 MG tablet Take 1 tablet (4 mg total) by mouth 2 (two) times daily as needed for allergies or rhinitis. 06/19/15   Charmayne Sheerharles M Beers, PA-C  diphenoxylate-atropine (LOMOTIL) 2.5-0.025 MG tablet Take 1 tablet by mouth 4 (four) times daily as needed for diarrhea or loose stools. 04/24/16 04/24/17  Joni Reiningonald K Eron Staat, PA-C  fluticasone (FLONASE) 50 MCG/ACT nasal spray Place 2 sprays into both nostrils daily.    Historical Provider, MD  guaiFENesin-codeine 100-10 MG/5ML syrup Take 10 mLs by mouth every 4 (four) hours as needed for  cough. 06/19/15   Charmayne Sheerharles M Beers, PA-C  ibuprofen (ADVIL,MOTRIN) 600 MG tablet Take 1 tablet (600 mg total) by mouth every 8 (eight) hours as needed. 04/24/16   Joni Reiningonald K Faust Thorington, PA-C  ibuprofen (ADVIL,MOTRIN) 800 MG tablet Take 1 tablet (800 mg total) by mouth every 8 (eight) hours as needed. 10/24/14   Phineas SemenGraydon Goodman, MD  ibuprofen (ADVIL,MOTRIN) 800 MG tablet Take 1 tablet (800 mg total) by mouth every 8 (eight) hours as needed for moderate pain. 08/27/15   Joni Reiningonald K Deshon Koslowski, PA-C  ondansetron (ZOFRAN) 8 MG tablet Take 1.5 tablets (12 mg total) by mouth every 8 (eight) hours as needed for nausea or vomiting. 04/24/16   Joni Reiningonald K Chukwuma Straus, PA-C  pseudoephedrine (SUDAFED) 60 MG tablet Take 1 tablet (60 mg total) by mouth every 4 (four) hours as needed for congestion. 06/19/15   Evangeline Dakinharles M Beers, PA-C    Allergies Patient has no known allergies.  No family history on file.  Social History Social History  Substance Use Topics  . Smoking status: Former Games developermoker  . Smokeless tobacco: Never Used  . Alcohol use No    Review of Systems Constitutional: Fever without chills.  Eyes: No visual changes. ENT: No sore throat. Cardiovascular: Denies chest pain. Respiratory: Denies shortness of breath. Gastrointestinal: No abdominal pain. States nausea, vomiting, and diarrhea.  Genitourinary: Negative for dysuria. Musculoskeletal: Negative for back pain. Skin: Negative for rash. Neurological: Negative for headaches, focal weakness or numbness. States transient vertigo with position change.    ____________________________________________   PHYSICAL EXAM:  VITAL SIGNS: ED Triage Vitals  Enc  Vitals Group     BP 04/24/16 1131 113/64     Pulse Rate 04/24/16 1131 80     Resp 04/24/16 1131 16     Temp 04/24/16 1131 98.9 F (37.2 C)     Temp Source 04/24/16 1131 Oral     SpO2 04/24/16 1131 97 %     Weight 04/24/16 1132 200 lb (90.7 kg)     Height 04/24/16 1132 6\' 1"  (1.854 m)     Head Circumference --       Peak Flow --      Pain Score 04/24/16 1132 0     Pain Loc --      Pain Edu? --      Excl. in GC? --     Constitutional: Alert and oriented. Appears malaise. Eyes: Conjunctivae are normal. PERRL. EOMI. Head: Atraumatic. Nose: No congestion/rhinnorhea. Mouth/Throat: Mucous membranes are moist.  Oropharynx non-erythematous. Neck: No stridor.  No cervical spine tenderness to palpation. Hematological/Lymphatic/Immunilogical: No cervical lymphadenopathy. Cardiovascular: Normal rate, regular rhythm. Grossly normal heart sounds.  Good peripheral circulation. Respiratory: Normal respiratory effort.  No retractions. Lungs CTAB. Gastrointestinal: Soft and nontender. No distention. No abdominal bruits. Hyperactive bowel sounds  No CVA tenderness. Musculoskeletal: No lower extremity tenderness nor edema.  No joint effusions. Neurologic:  Normal speech and language. No gross focal neurologic deficits are appreciated. No gait instability. Skin:  Skin is warm, dry and intact. No rash noted. Psychiatric: Mood and affect are normal. Speech and behavior are normal.  ____________________________________________   LABS (all labs ordered are listed, but only abnormal results are displayed)  Labs Reviewed  URINALYSIS COMPLETEWITH MICROSCOPIC ([facility] ONLY) - Abnormal; Notable for the following:       Result Value   Color, Urine YELLOW (*)    APPearance CLEAR (*)    Ketones, ur 2+ (*)    Specific Gravity, Urine 1.033 (*)    Protein, ur 30 (*)    Squamous Epithelial / LPF 0-5 (*)    All other components within normal limits  LIPASE, BLOOD  COMPREHENSIVE METABOLIC PANEL  CBC   ____________________________________________  EKG   ____________________________________________  RADIOLOGY   ____________________________________________   PROCEDURES  Procedure(s) performed:   Procedures  Critical Care performed: No  ____________________________________________   INITIAL IMPRESSION /  ASSESSMENT AND PLAN / ED COURSE  Pertinent labs & imaging results that were available during my care of the patient were reviewed by me and considered in my medical decision making (see chart for details).  Gastroenteritis. Patient given discharge care instructions. Patient given a work note. Patient given prescription for Zofran and Lomotil. Patient advised to use Tylenol or ibuprofen for fever control.  Clinical Course    Patient urine showed 2+ ketones. Rehydrate patient 1000 cc normal saline. Patient nausea control was Zofran IV. Patient had loose bowel movement while waiting for lab results. ____________________________________________   FINAL CLINICAL IMPRESSION(S) / ED DIAGNOSES  Final diagnoses:  Viral syndrome  Dehydration  Vomiting and diarrhea      NEW MEDICATIONS STARTED DURING THIS VISIT:  New Prescriptions   DIPHENOXYLATE-ATROPINE (LOMOTIL) 2.5-0.025 MG TABLET    Take 1 tablet by mouth 4 (four) times daily as needed for diarrhea or loose stools.   IBUPROFEN (ADVIL,MOTRIN) 600 MG TABLET    Take 1 tablet (600 mg total) by mouth every 8 (eight) hours as needed.   ONDANSETRON (ZOFRAN) 8 MG TABLET    Take 1.5 tablets (12 mg total) by mouth every 8 (eight) hours as needed  for nausea or vomiting.     Note:  This document was prepared using Dragon voice recognition software and may include unintentional dictation errors.    Joni ReiningRonald K Cinthya Bors, PA-C 04/24/16 1232    Myrna Blazeravid Matthew Schaevitz, MD 04/24/16 [phone]

## 2016-04-24 NOTE — ED Notes (Signed)
Patient presents to the ED with dizziness, diarrhea, vomiting, and fever.  Patient denies cough and congestion.  Patient reports headache with sitting or standing and increased dizziness with changes in position.

## 2016-07-01 ENCOUNTER — Emergency Department: Payer: Self-pay

## 2016-07-01 ENCOUNTER — Emergency Department
Admission: EM | Admit: 2016-07-01 | Discharge: 2016-07-01 | Disposition: A | Discharge status: Home / Self Care | Payer: Self-pay | Attending: Emergency Medicine | Admitting: Emergency Medicine

## 2016-07-01 ENCOUNTER — Encounter: Payer: Self-pay | Admitting: Emergency Medicine

## 2016-07-01 DIAGNOSIS — J040 Acute laryngitis: Secondary | ICD-10-CM | POA: Insufficient documentation

## 2016-07-01 DIAGNOSIS — J209 Acute bronchitis, unspecified: Secondary | ICD-10-CM | POA: Insufficient documentation

## 2016-07-01 DIAGNOSIS — Z87891 Personal history of nicotine dependence: Secondary | ICD-10-CM | POA: Insufficient documentation

## 2016-07-01 DIAGNOSIS — Z791 Long term (current) use of non-steroidal anti-inflammatories (NSAID): Secondary | ICD-10-CM | POA: Insufficient documentation

## 2016-07-01 LAB — INFLUENZA PANEL BY PCR (TYPE A & B)
Influenza A By PCR: NEGATIVE
Influenza B By PCR: NEGATIVE

## 2016-07-01 MED ORDER — AZITHROMYCIN 250 MG PO TABS: ORAL_TABLET | ORAL | 0 refills | Status: AC

## 2016-07-01 MED ORDER — AZITHROMYCIN 500 MG PO TABS
500.0000 mg | ORAL_TABLET | Freq: Once | ORAL | Status: AC
Start: 2016-07-01 — End: 2016-07-01
  Administered 2016-07-01: 500 mg via ORAL
  Filled 2016-07-01: qty 1

## 2016-07-01 NOTE — ED Triage Notes (Signed)
Patient ambulatory to triage with steady gait, without difficulty or distress noted, mask in place; pt reports since yesterday having cough, congestion, and voice hoarseness

## 2016-07-01 NOTE — ED Provider Notes (Signed)
[facility] Emergency Department Provider Note   First MD Initiated Contact with Patient 07/01/16 [phone]     (approximate)  I have reviewed the triage vital signs and the nursing notes.   HISTORY  Chief Complaint Nasal Congestion and Cough   HPI Karl Johnson is a 26 y.o. male presents with  cough, congestion and hoarseness.Patient denies any fever no n/vomiting or diarrhea. Patient afebrile on presentation temperature 97.7   Past medical history None There are no active problems to display for this patient.   Past surgical history None  Prior to Admission medications   Medication Sig Start Date End Date Taking? Authorizing Provider  azithromycin (ZITHROMAX Z-PAK) 250 MG tablet Take 2 tablets (500 mg) on  Day 1,  followed by 1 tablet (250 mg) once daily on Days 2 through 5. 06/19/15   Charmayne Sheerharles M Beers, PA-C  azithromycin (ZITHROMAX Z-PAK) 250 MG tablet Take 2 tablets (500 mg) on  Day 1,  followed by 1 tablet (250 mg) once daily on Days 2 through 5. 07/01/16 07/06/16  Darci Currentandolph N Brown, MD  brompheniramine-pseudoephedrine-DM 30-2-10 MG/5ML syrup Take 5 mLs by mouth 4 (four) times daily as needed. 08/27/15   Joni Reiningonald K Smith, PA-C  chlorpheniramine (CHLOR-TRIMETON) 4 MG tablet Take 1 tablet (4 mg total) by mouth 2 (two) times daily as needed for allergies or rhinitis. 06/19/15   Charmayne Sheerharles M Beers, PA-C  diphenoxylate-atropine (LOMOTIL) 2.5-0.025 MG tablet Take 1 tablet by mouth 4 (four) times daily as needed for diarrhea or loose stools. 04/24/16 04/24/17  Joni Reiningonald K Smith, PA-C  fluticasone (FLONASE) 50 MCG/ACT nasal spray Place 2 sprays into both nostrils daily.    Historical Provider, MD  guaiFENesin-codeine 100-10 MG/5ML syrup Take 10 mLs by mouth every 4 (four) hours as needed for cough. 06/19/15   Charmayne Sheerharles M Beers, PA-C  ibuprofen (ADVIL,MOTRIN) 600 MG tablet Take 1 tablet (600 mg total) by mouth every 8 (eight) hours as needed. 04/24/16   Joni Reiningonald K Smith, PA-C  ibuprofen  (ADVIL,MOTRIN) 800 MG tablet Take 1 tablet (800 mg total) by mouth every 8 (eight) hours as needed. 10/24/14   Phineas SemenGraydon Goodman, MD  ibuprofen (ADVIL,MOTRIN) 800 MG tablet Take 1 tablet (800 mg total) by mouth every 8 (eight) hours as needed for moderate pain. 08/27/15   Joni Reiningonald K Smith, PA-C  ondansetron (ZOFRAN) 8 MG tablet Take 1.5 tablets (12 mg total) by mouth every 8 (eight) hours as needed for nausea or vomiting. 04/24/16   Joni Reiningonald K Smith, PA-C  pseudoephedrine (SUDAFED) 60 MG tablet Take 1 tablet (60 mg total) by mouth every 4 (four) hours as needed for congestion. 06/19/15   Evangeline Dakinharles M Beers, PA-C    Allergies Patient has no known allergies.  No family history on file.  Social History Social History  Substance Use Topics  . Smoking status: Former Games developermoker  . Smokeless tobacco: Never Used  . Alcohol use No    Review of Systems Constitutional: No fever/chills Eyes: No visual changes. ENT: Positive for hoarseness Cardiovascular: Denies chest pain. Respiratory: Denies shortness of breath. Positive for cough Gastrointestinal: No abdominal pain.  No nausea, no vomiting.  No diarrhea.  No constipation. Genitourinary: Negative for dysuria. Musculoskeletal: Negative for back pain. Skin: Negative for rash. Neurological: Negative for headaches, focal weakness or numbness.  10-point ROS otherwise negative.  ____________________________________________   PHYSICAL EXAM:  VITAL SIGNS: ED Triage Vitals  Enc Vitals Group     BP 07/01/16 0304 135/82     Pulse  Rate 07/01/16 0304 80     Resp 07/01/16 0304 18     Temp 07/01/16 0304 97.7 F (36.5 C)     Temp Source 07/01/16 0304 Oral     SpO2 07/01/16 0304 98 %     Weight 07/01/16 0302 200 lb (90.7 kg)     Height 07/01/16 0302 6\' 1"  (1.854 m)     Head Circumference --      Peak Flow --      Pain Score 07/01/16 0511 0     Pain Loc --      Pain Edu? --      Excl. in GC? --     Constitutional: Alert and oriented. Well appearing and in  no acute distress. Eyes: Conjunctivae are normal. PERRL. EOMI. Head: Atraumatic. Mouth/Throat: Mucous membranes are moist.  Oropharynx erythematous. Neck: No stridor.   Cardiovascular: Normal rate, regular rhythm. Good peripheral circulation. Grossly normal heart sounds. Respiratory: Normal respiratory effort.  No retractions. Lungs CTAB. Gastrointestinal: Soft and nontender. No distention.  Musculoskeletal: No lower extremity tenderness nor edema. No gross deformities of extremities. Neurologic:  Normal speech and language. No gross focal neurologic deficits are appreciated.  Skin:  Skin is warm, dry and intact. No rash noted.   ____________________________________________   LABS (all labs ordered are listed, but only abnormal results are displayed)  Labs Reviewed  INFLUENZA PANEL BY PCR (TYPE A & B)     RADIOLOGY I, [place] N BROWN, personally viewed and evaluated these images (plain radiographs) as part of my medical decision making, as well as reviewing the written report by the radiologist.  Dg Chest 2 View  Result Date: 07/01/2016 CLINICAL DATA:  Cough congestion and voice hoarseness EXAM: CHEST  2 VIEW COMPARISON:  10/31/2011 FINDINGS: No focal consolidation or effusion. Coarse interstitial opacities at the left lower lung zone are similar compared to prior radiograph. Hyperinflation. Cardiomediastinal silhouette normal. No pneumothorax. IMPRESSION: No focal infiltrate or edema Electronically Signed   By: Jasmine Pang M.D.   On: 07/01/2016 03:32     Procedures   INITIAL IMPRESSION / ASSESSMENT AND PLAN / ED COURSE  Pertinent labs & imaging results that were available during my care of the patient were reviewed by me and considered in my medical decision making (see chart for details).  History physical exam consistent with laryngitis/bronchitis patient admits to coworker having similar symptoms.   Clinical Course      ____________________________________________  FINAL CLINICAL IMPRESSION(S) / ED DIAGNOSES  Final diagnoses:  Laryngitis  Acute bronchitis, unspecified organism     MEDICATIONS GIVEN DURING THIS VISIT:  Medications  azithromycin (ZITHROMAX) tablet 500 mg (500 mg Oral Given 07/01/16 0512)     NEW OUTPATIENT MEDICATIONS STARTED DURING THIS VISIT:  New Prescriptions   AZITHROMYCIN (ZITHROMAX Z-PAK) 250 MG TABLET    Take 2 tablets (500 mg) on  Day 1,  followed by 1 tablet (250 mg) once daily on Days 2 through 5.    Modified Medications   No medications on file    Discontinued Medications   No medications on file     Note:  This document was prepared using Dragon voice recognition software and may include unintentional dictation errors.    Darci Current, MD 07/02/16 [phone]

## 2016-09-30 ENCOUNTER — Emergency Department: Payer: Managed Care, Other (non HMO)

## 2016-09-30 ENCOUNTER — Emergency Department
Admission: EM | Admit: 2016-09-30 | Discharge: 2016-09-30 | Disposition: A | Discharge status: Home / Self Care | Payer: Managed Care, Other (non HMO) | Attending: Student in an Organized Health Care Education/Training Program | Admitting: Student in an Organized Health Care Education/Training Program

## 2016-09-30 DIAGNOSIS — R079 Chest pain, unspecified: Secondary | ICD-10-CM | POA: Insufficient documentation

## 2016-09-30 DIAGNOSIS — Z791 Long term (current) use of non-steroidal anti-inflammatories (NSAID): Secondary | ICD-10-CM | POA: Diagnosis not present

## 2016-09-30 DIAGNOSIS — Z87891 Personal history of nicotine dependence: Secondary | ICD-10-CM | POA: Insufficient documentation

## 2016-09-30 DIAGNOSIS — M79602 Pain in left arm: Secondary | ICD-10-CM | POA: Diagnosis present

## 2016-09-30 DIAGNOSIS — Z79899 Other long term (current) drug therapy: Secondary | ICD-10-CM | POA: Diagnosis not present

## 2016-09-30 LAB — TROPONIN I: Troponin I: <0.03 ng/mL (ref <0.03)

## 2016-09-30 MED ORDER — CYCLOBENZAPRINE HCL 10 MG PO TABS: 10.0000 mg | ORAL_TABLET | Freq: Three times a day (TID) | ORAL | 0 refills | Status: DC | PRN

## 2016-09-30 MED ORDER — NAPROXEN 500 MG PO TABS: 500.0000 mg | ORAL_TABLET | Freq: Two times a day (BID) | ORAL | 0 refills | Status: AC

## 2016-09-30 NOTE — ED Triage Notes (Signed)
Pt brought over from [facility] with c/o central chest pain that radiates into the left arm with SOB since 3am..

## 2016-09-30 NOTE — ED Provider Notes (Signed)
[facility] Emergency Department Provider Note    None    (approximate)  I have reviewed the triage vital signs and the nursing notes.   HISTORY  Chief Complaint Chest Pain    HPI Karl Johnson is a 26 y.o. male presents with left arm pain with patient from sleep around 3 AM and then migrated into his left chest. Denies any chest pain right now. Denies any shortness of breath. Does have a history of bronchitis but is stopped smoking and does not feel short of breath at this time.Denies any nausea or vomiting. No pain with taking a deep breath. Pain is worsened by moving his left arm. States he has had pain similar to this in the past. States he does a lot of heavy lifting at work. Denies any recent fevers. Denies any family history of sudden cardiac death. No personal history of heart disease.   History reviewed. No pertinent past medical history. FMH: no sudden cardiac deaths History reviewed. No pertinent surgical history. There are no active problems to display for this patient.     Prior to Admission medications   Medication Sig Start Date End Date Taking? Authorizing Provider  azithromycin (ZITHROMAX Z-PAK) 250 MG tablet Take 2 tablets (500 mg) on  Day 1,  followed by 1 tablet (250 mg) once daily on Days 2 through 5. 06/19/15   Evangeline Dakin, PA-C  brompheniramine-pseudoephedrine-DM 30-2-10 MG/5ML syrup Take 5 mLs by mouth 4 (four) times daily as needed. 08/27/15   Joni Reining, PA-C  chlorpheniramine (CHLOR-TRIMETON) 4 MG tablet Take 1 tablet (4 mg total) by mouth 2 (two) times daily as needed for allergies or rhinitis. 06/19/15   Charmayne Sheer Beers, PA-C  cyclobenzaprine (FLEXERIL) 10 MG tablet Take 1 tablet (10 mg total) by mouth 3 (three) times daily as needed for muscle spasms. 09/30/16   Willy Eddy, MD  diphenoxylate-atropine (LOMOTIL) 2.5-0.025 MG tablet Take 1 tablet by mouth 4 (four) times daily as needed for diarrhea or loose stools.  04/24/16 04/24/17  Joni Reining, PA-C  fluticasone (FLONASE) 50 MCG/ACT nasal spray Place 2 sprays into both nostrils daily.    Historical Provider, MD  guaiFENesin-codeine 100-10 MG/5ML syrup Take 10 mLs by mouth every 4 (four) hours as needed for cough. 06/19/15   Charmayne Sheer Beers, PA-C  ibuprofen (ADVIL,MOTRIN) 600 MG tablet Take 1 tablet (600 mg total) by mouth every 8 (eight) hours as needed. 04/24/16   Joni Reining, PA-C  ibuprofen (ADVIL,MOTRIN) 800 MG tablet Take 1 tablet (800 mg total) by mouth every 8 (eight) hours as needed. 10/24/14   Phineas Semen, MD  ibuprofen (ADVIL,MOTRIN) 800 MG tablet Take 1 tablet (800 mg total) by mouth every 8 (eight) hours as needed for moderate pain. 08/27/15   Joni Reining, PA-C  naproxen (NAPROSYN) 500 MG tablet Take 1 tablet (500 mg total) by mouth 2 (two) times daily with a meal. 09/30/16 09/30/17  Willy Eddy, MD  ondansetron (ZOFRAN) 8 MG tablet Take 1.5 tablets (12 mg total) by mouth every 8 (eight) hours as needed for nausea or vomiting. 04/24/16   Joni Reining, PA-C  pseudoephedrine (SUDAFED) 60 MG tablet Take 1 tablet (60 mg total) by mouth every 4 (four) hours as needed for congestion. 06/19/15   Evangeline Dakin, PA-C    Allergies Patient has no known allergies.    Social History Social History  Substance Use Topics  . Smoking status: Former Games developer  . Smokeless tobacco:  Never Used  . Alcohol use No    Review of Systems Patient denies headaches, rhinorrhea, blurry vision, numbness, shortness of breath, chest pain, edema, cough, abdominal pain, nausea, vomiting, diarrhea, dysuria, fevers, rashes or hallucinations unless otherwise stated above in HPI. ____________________________________________   PHYSICAL EXAM:  VITAL SIGNS: Vitals:   09/30/16 0822 09/30/16 0943  BP: (!) 143/89 125/81  Pulse: 80 83  Resp: 12 15  Temp:      Constitutional: Alert and oriented. Well appearing and in no acute distress. Eyes: Conjunctivae are  normal. PERRL. EOMI. Head: Atraumatic. Nose: No congestion/rhinnorhea. Mouth/Throat: Mucous membranes are moist.  Oropharynx non-erythematous. Neck: No stridor. Painless ROM. No cervical spine tenderness to palpation Hematological/Lymphatic/Immunilogical: No cervical lymphadenopathy. Cardiovascular: Normal rate, regular rhythm. Grossly normal heart sounds.  Good peripheral circulation. Respiratory: Normal respiratory effort.  No retractions. Lungs CTAB. Gastrointestinal: Soft and nontender. No distention. No abdominal bruits. No CVA tenderness. Genitourinary:  Musculoskeletal: No lower extremity tenderness nor edema.  No joint effusions.  Pain with palpation of left pectoralis muscle and left bicep.  2+ pulses throughout.  Brisk cap refill Neurologic:  Normal speech and language. No gross focal neurologic deficits are appreciated. No gait instability. Skin:  Skin is warm, dry and intact. No rash noted. Psychiatric: Mood and affect are normal. Speech and behavior are normal.  ____________________________________________   LABS (all labs ordered are listed, but only abnormal results are displayed)  Results for orders placed or performed during the hospital encounter of 09/30/16 (from the past 24 hour(s))  Troponin I     Status: None   Collection Time: 09/30/16  8:54 AM  Result Value Ref Range   Troponin I <0.03 <0.03 ng/mL   ____________________________________________  EKG My review and personal interpretation at Time: 8:15   Indication: chest pain  Rate: 70  Rhythm: sinus Axis: normal Other: normal intervals, no st elevations or depressions ____________________________________________  RADIOLOGY  I personally reviewed all radiographic images ordered to evaluate for the above acute complaints and reviewed radiology reports and findings.  These findings were personally discussed with the patient.  Please see medical record for radiology  report.  ____________________________________________   PROCEDURES  Procedure(s) performed:  Procedures    Critical Care performed: no ____________________________________________   INITIAL IMPRESSION / ASSESSMENT AND PLAN / ED COURSE  Pertinent labs & imaging results that were available during my care of the patient were reviewed by me and considered in my medical decision making (see chart for details).  DDX: ACS, pericarditis, bronchitis, costochondritis, pna, Pe, dissection   Virgie Chery is a 26 y.o. who presents to the ED with left arm pain and pectoralis muscle pain as described above.  Patient is AFVSS in ED. Exam as above. Given current presentation have considered the above differential.  Low risk Wells and PERC negative. HEART score of 1.  CXR shows no evidence of pneumothorax or consolidation. This is not clinically consistent with dissection. Do suspect muscle strain. Patient is in no acute distress. Stable for DC home with follow-up with PCP.      ____________________________________________   FINAL CLINICAL IMPRESSION(S) / ED DIAGNOSES  Final diagnoses:  Left arm pain      NEW MEDICATIONS STARTED DURING THIS VISIT:  New Prescriptions   CYCLOBENZAPRINE (FLEXERIL) 10 MG TABLET    Take 1 tablet (10 mg total) by mouth 3 (three) times daily as needed for muscle spasms.   NAPROXEN (NAPROSYN) 500 MG TABLET    Take 1 tablet (500 mg total) by  mouth 2 (two) times daily with a meal.     Note:  This document was prepared using Dragon voice recognition software and may include unintentional dictation errors.     Willy Eddy, MD 09/30/16 1009

## 2016-09-30 NOTE — ED Notes (Signed)
Patient transported to X-ray 

## 2017-06-16 IMAGING — CR DG CHEST 2V
2 series · 2 of 2 positions shown · non-contrast
Comparison: 07/01/2016.

CLINICAL DATA: Central chest pain radiating to the LEFT arm. Former
smoker.

EXAM:
CHEST  2 VIEW

[chest pa]
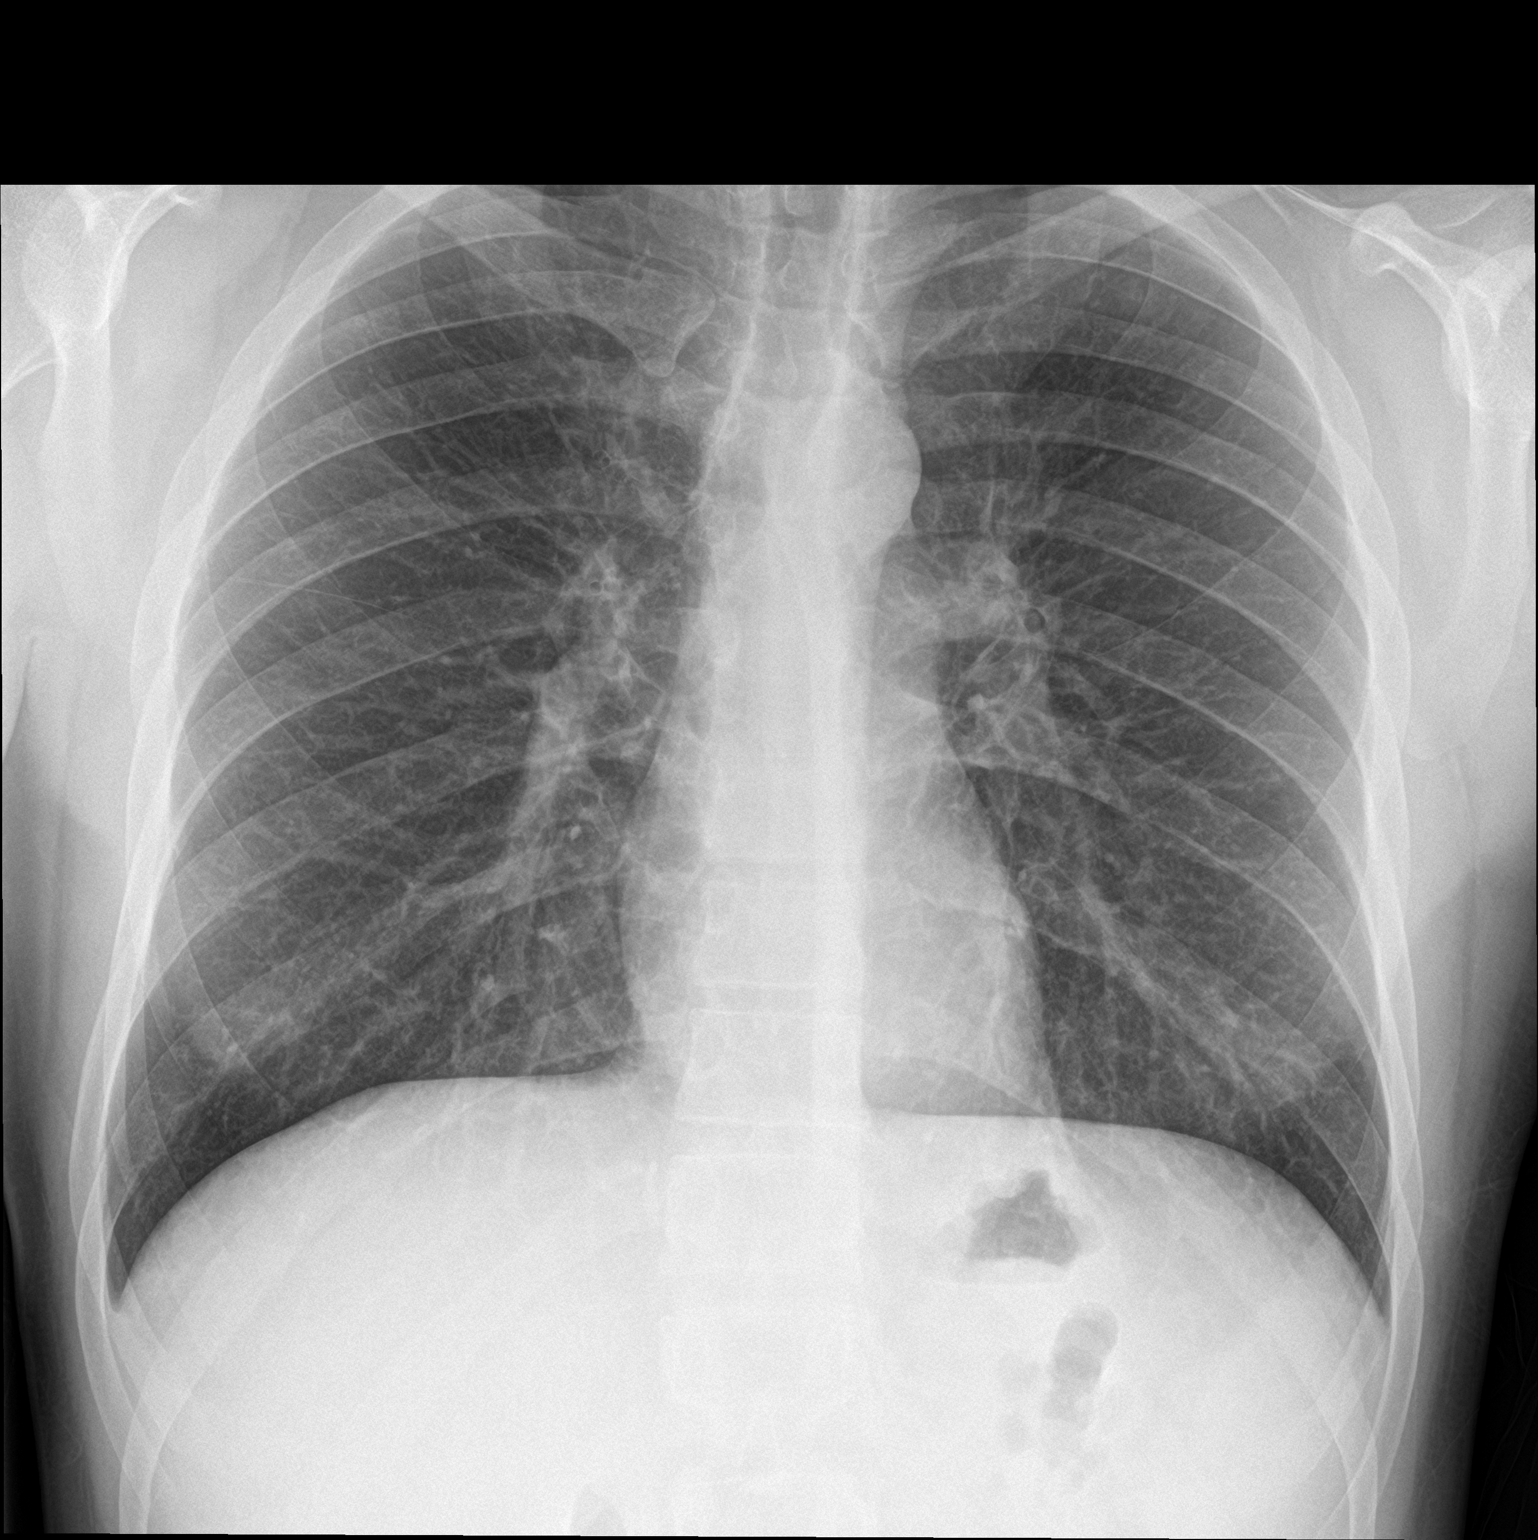

[chest lat]
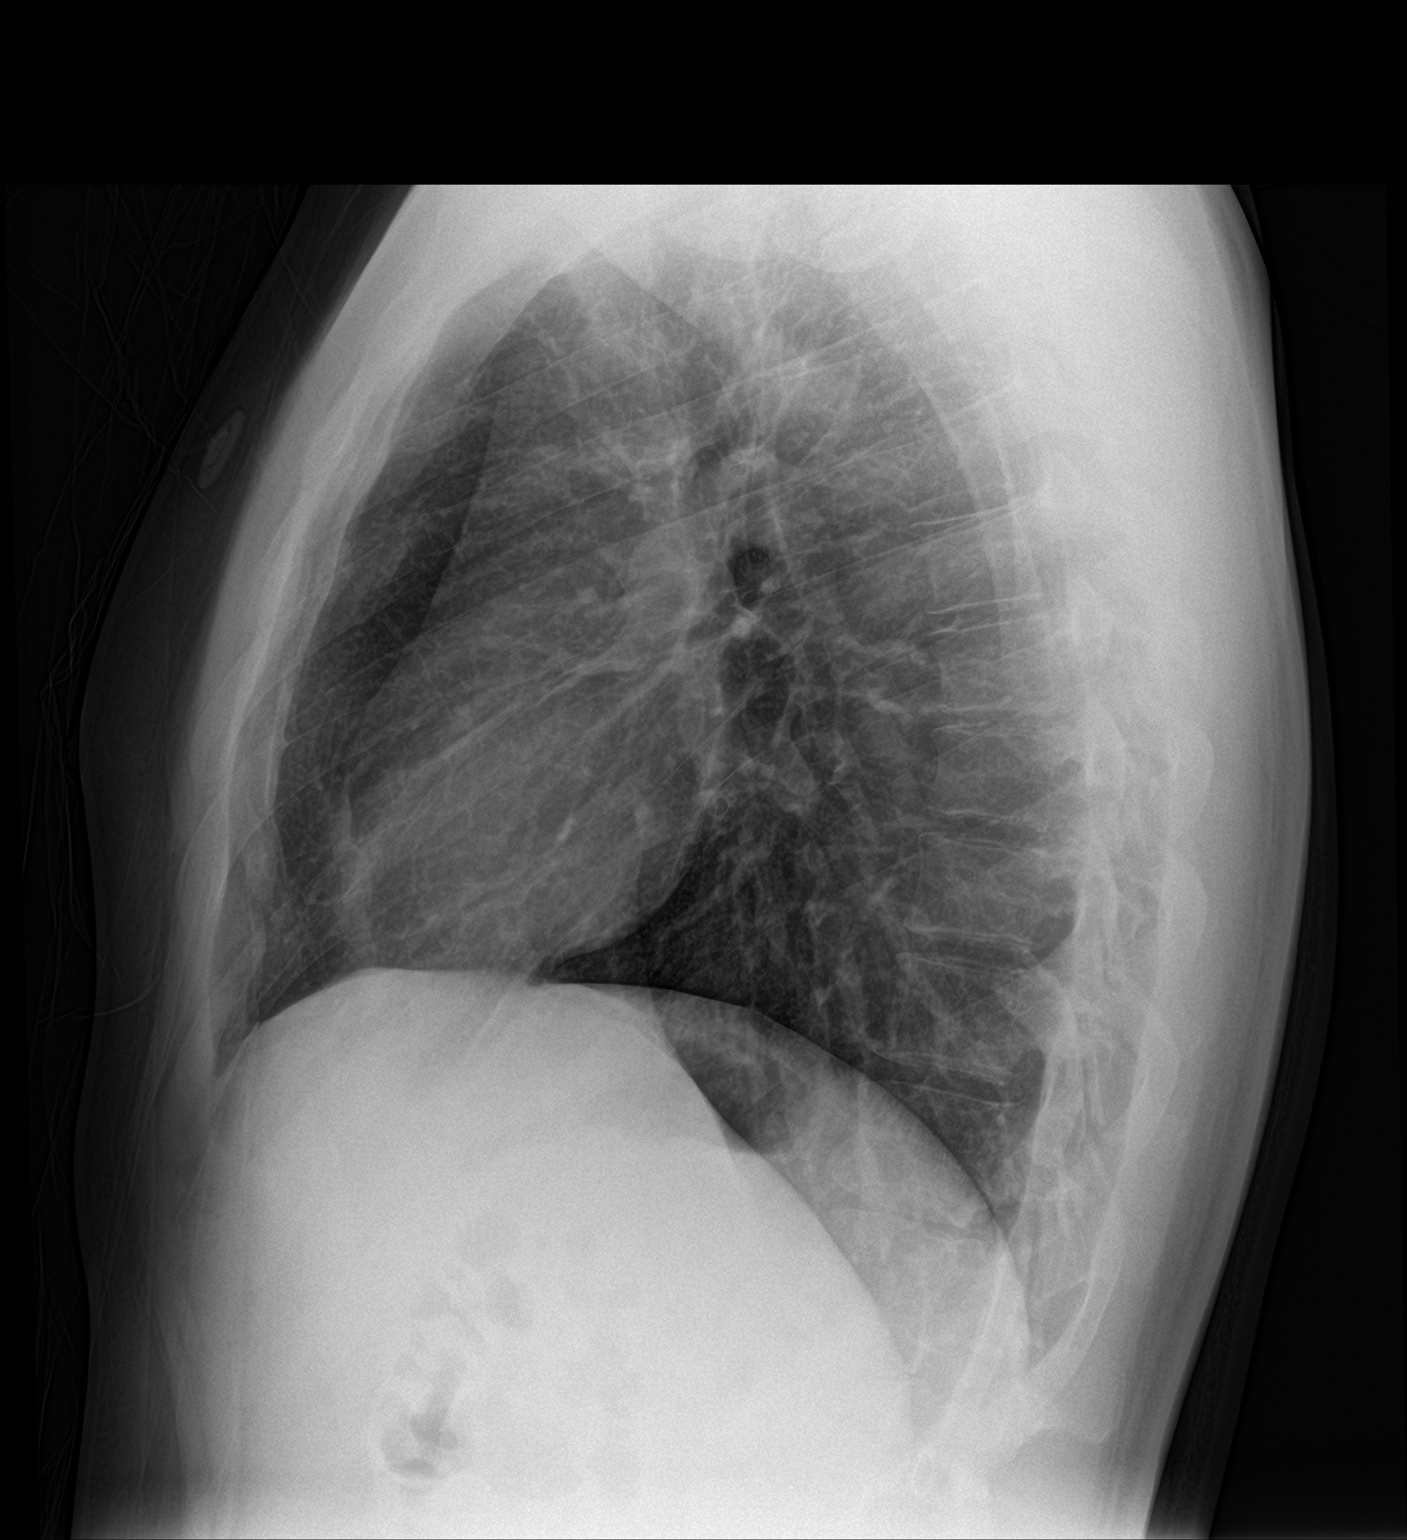

[2 of 2 positions shown; findings below may reference images not displayed]

FINDINGS: The heart size and mediastinal contours are within normal limits.
Both lungs are clear. The visualized skeletal structures are
unremarkable.
IMPRESSION: No active cardiopulmonary disease.  Stable exam from priors.

## 2017-11-30 ENCOUNTER — Other Ambulatory Visit: Payer: Self-pay

## 2017-11-30 ENCOUNTER — Emergency Department
Admission: EM | Admit: 2017-11-30 | Discharge: 2017-11-30 | Disposition: A | Discharge status: Home / Self Care | Payer: PRIVATE HEALTH INSURANCE | Attending: Emergency Medicine | Admitting: Emergency Medicine

## 2017-11-30 DIAGNOSIS — K529 Noninfective gastroenteritis and colitis, unspecified: Secondary | ICD-10-CM

## 2017-11-30 DIAGNOSIS — Z87891 Personal history of nicotine dependence: Secondary | ICD-10-CM | POA: Insufficient documentation

## 2017-11-30 LAB — COMPREHENSIVE METABOLIC PANEL
ALT: 23 U/L (ref 17–63)
AST: 26 U/L (ref 15–41)
Albumin: 4.3 g/dL (ref 3.5–5.0)
Alkaline Phosphatase: 97 U/L (ref 38–126)
Anion gap: 7 (ref 5–15)
BUN: 11 mg/dL (ref 6–20)
CO2: 24 mmol/L (ref 22–32)
Calcium: 9.1 mg/dL (ref 8.9–10.3)
Chloride: 106 mmol/L (ref 101–111)
Creatinine, Ser: 0.77 mg/dL (ref 0.61–1.24)
GFR calc Af Amer: >60 mL/min (ref >60)
GFR calc non Af Amer: >60 mL/min (ref >60)
Glucose, Bld: 125 mg/dL — ABNORMAL HIGH (ref 65–99)
Potassium: 4.2 mmol/L (ref 3.5–5.1)
Sodium: 137 mmol/L (ref 135–145)
Total Bilirubin: 0.4 mg/dL (ref 0.3–1.2)
Total Protein: 7.4 g/dL (ref 6.5–8.1)

## 2017-11-30 LAB — CBC
HCT: 48.1 % (ref 40.0–52.0)
Hemoglobin: 16.6 g/dL (ref 13.0–18.0)
MCH: 29.1 pg (ref 26.0–34.0)
MCHC: 34.6 g/dL (ref 32.0–36.0)
MCV: 84.2 fL (ref 80.0–100.0)
Platelets: 325 10*3/uL (ref 150–440)
RBC: 5.72 MIL/uL (ref 4.40–5.90)
RDW: 13.2 % (ref 11.5–14.5)
WBC: 6.8 10*3/uL (ref 3.8–10.6)

## 2017-11-30 LAB — LIPASE, BLOOD: Lipase: 37 U/L (ref 11–51)

## 2017-11-30 MED ORDER — ONDANSETRON 4 MG PO TBDP: 4.0000 mg | ORAL_TABLET | Freq: Three times a day (TID) | ORAL | 0 refills | Status: DC | PRN

## 2017-11-30 MED ORDER — ONDANSETRON 4 MG PO TBDP
4.0000 mg | ORAL_TABLET | Freq: Once | ORAL | Status: AC
  Administered 2017-11-30: 4 mg via ORAL
  Filled 2017-11-30: qty 1

## 2017-11-30 NOTE — ED Notes (Signed)
Pt given ice water for PO challenge per MD request

## 2017-11-30 NOTE — ED Triage Notes (Signed)
Reports nausea, vomiting and diarrhea that began yesterday.

## 2017-11-30 NOTE — ED Provider Notes (Signed)
[facility] Emergency Department Provider Note   ____________________________________________    I have reviewed the triage vital signs and the nursing notes.   HISTORY  Chief Complaint Emesis and Diarrhea     HPI Karl Johnson is a 27 y.o. male who presents with complaints of nausea vomiting and diarrhea which started approximately 1130 last night.  Patient reports he was feeling well prior to that.  He reports he woke up and had immediately have diarrhea.  Shortly thereafter he developed nausea and vomiting.  Reports 3 episodes of emesis throughout the night, nonbloody nonbilious.  No history of abdominal surgery.  Reports he is feeling better now.  No sick contacts noted.  No recent travel.  No abdominal pain   No past medical history on file.  There are no active problems to display for this patient.   No past surgical history on file.  Prior to Admission medications   Medication Sig Start Date End Date Taking? Authorizing Provider  brompheniramine-pseudoephedrine-DM 30-2-10 MG/5ML syrup Take 5 mLs by mouth 4 (four) times daily as needed. Patient not taking: Reported on 11/30/2017 08/27/15   Joni Reining, PA-C  chlorpheniramine (CHLOR-TRIMETON) 4 MG tablet Take 1 tablet (4 mg total) by mouth 2 (two) times daily as needed for allergies or rhinitis. Patient not taking: Reported on 11/30/2017 06/19/15   Evangeline Dakin, PA-C  cyclobenzaprine (FLEXERIL) 10 MG tablet Take 1 tablet (10 mg total) by mouth 3 (three) times daily as needed for muscle spasms. Patient not taking: Reported on 11/30/2017 09/30/16   Willy Eddy, MD  guaiFENesin-codeine 100-10 MG/5ML syrup Take 10 mLs by mouth every 4 (four) hours as needed for cough. Patient not taking: Reported on 11/30/2017 06/19/15   Beers, Charmayne Sheer, PA-C  ibuprofen (ADVIL,MOTRIN) 600 MG tablet Take 1 tablet (600 mg total) by mouth every 8 (eight) hours as needed. Patient not taking: Reported on 11/30/2017  04/24/16   Joni Reining, PA-C  ibuprofen (ADVIL,MOTRIN) 800 MG tablet Take 1 tablet (800 mg total) by mouth every 8 (eight) hours as needed. Patient not taking: Reported on 11/30/2017 10/24/14   Phineas Semen, MD  ibuprofen (ADVIL,MOTRIN) 800 MG tablet Take 1 tablet (800 mg total) by mouth every 8 (eight) hours as needed for moderate pain. Patient not taking: Reported on 11/30/2017 08/27/15   Joni Reining, PA-C  ondansetron (ZOFRAN ODT) 4 MG disintegrating tablet Take 1 tablet (4 mg total) by mouth every 8 (eight) hours as needed for nausea or vomiting. 11/30/17   Jene Every, MD  ondansetron (ZOFRAN) 8 MG tablet Take 1.5 tablets (12 mg total) by mouth every 8 (eight) hours as needed for nausea or vomiting. Patient not taking: Reported on 11/30/2017 04/24/16   Joni Reining, PA-C  pseudoephedrine (SUDAFED) 60 MG tablet Take 1 tablet (60 mg total) by mouth every 4 (four) hours as needed for congestion. Patient not taking: Reported on 11/30/2017 06/19/15   Evangeline Dakin, PA-C     Allergies Patient has no known allergies.  No family history on file.  Social History Social History   Tobacco Use  . Smoking status: Former Games developer  . Smokeless tobacco: Never Used  Substance Use Topics  . Alcohol use: No  . Drug use: Not on file    Review of Systems  Constitutional: Occasional chills Eyes: No visual changes.  ENT: No sore throat. Cardiovascular: Denies chest pain. Respiratory: No cough. Gastrointestinal: As above Genitourinary: Negative for dysuria. Musculoskeletal: Negative for myalgias  Skin: Negative for rash. Neurological: Negative for headaches    ____________________________________________   PHYSICAL EXAM:  VITAL SIGNS: ED Triage Vitals  Enc Vitals Group     BP 11/30/17 0426 118/77     Pulse Rate 11/30/17 0426 68     Resp 11/30/17 0426 18     Temp 11/30/17 0426 98.3 F (36.8 C)     Temp Source 11/30/17 0426 Oral     SpO2 11/30/17 0426 97 %     Weight  11/30/17 0423 95.3 kg (210 lb)     Height 11/30/17 0423 1.88 m (6\' 2" )     Head Circumference --      Peak Flow --      Pain Score 11/30/17 0423 0     Pain Loc --      Pain Edu? --      Excl. in GC? --     Constitutional: Alert and oriented. No acute distress.  Eyes: Conjunctivae are normal.   Nose: No congestion/rhinnorhea. Mouth/Throat: Mucous membranes are moist.    Cardiovascular: Normal rate, regular rhythm. Grossly normal heart sounds.  Good peripheral circulation. Respiratory: Normal respiratory effort.  No retractions. Lungs CTAB. Gastrointestinal: Soft and nontender. No distention.    Musculoskeletal: .  Warm and well perfused Neurologic:  Normal speech and language. No gross focal neurologic deficits are appreciated.  Skin:  Skin is warm, dry and intact. No rash noted. Psychiatric: Mood and affect are normal. Speech and behavior are normal.  ____________________________________________   LABS (all labs ordered are listed, but only abnormal results are displayed)  Labs Reviewed  COMPREHENSIVE METABOLIC PANEL - Abnormal; Notable for the following components:      Result Value   Glucose, Bld 125 (*)    All other components within normal limits  LIPASE, BLOOD  CBC  URINALYSIS, COMPLETE (UACMP) WITH MICROSCOPIC   ____________________________________________  EKG  None ____________________________________________  RADIOLOGY  None ____________________________________________   PROCEDURES  Procedure(s) performed: No  Procedures   Critical Care performed: No ____________________________________________   INITIAL IMPRESSION / ASSESSMENT AND PLAN / ED COURSE  Pertinent labs & imaging results that were available during my care of the patient were reviewed by me and considered in my medical decision making (see chart for details).  Patient well-appearing in no acute distress.  Exam is quite reassuring.  No abdominal tenderness palpation, normal exam.   Lab work unremarkable.  No evidence of dehydration on exam or labs.  Will treat with p.o. Zofran, p.o. challenge anticipate discharge with supportive measures    ____________________________________________   FINAL CLINICAL IMPRESSION(S) / ED DIAGNOSES  Final diagnoses:  Gastroenteritis        Note:  This document was prepared using Dragon voice recognition software and may include unintentional dictation errors.    Jene EveryKinner, Kortland Nichols, MD 11/30/17 1016

## 2017-11-30 NOTE — ED Notes (Signed)
Patient c/o N/V/D beginning at 2300 yesterday. Patient reports weakness and chills. Patient denies dizziness, lightheadedness, and abdominal pains

## 2017-12-08 ENCOUNTER — Encounter (HOSPITAL_COMMUNITY): Payer: Self-pay | Admitting: Emergency Medicine

## 2017-12-08 ENCOUNTER — Emergency Department (HOSPITAL_COMMUNITY)
Admission: EM | Admit: 2017-12-08 | Discharge: 2017-12-08 | Disposition: A | Discharge status: Home / Self Care | Payer: Self-pay | Attending: Emergency Medicine | Admitting: Emergency Medicine

## 2017-12-08 ENCOUNTER — Other Ambulatory Visit: Payer: Self-pay

## 2017-12-08 DIAGNOSIS — L03312 Cellulitis of back [any part except buttock]: Secondary | ICD-10-CM | POA: Insufficient documentation

## 2017-12-08 DIAGNOSIS — Z87891 Personal history of nicotine dependence: Secondary | ICD-10-CM | POA: Insufficient documentation

## 2017-12-08 DIAGNOSIS — Y929 Unspecified place or not applicable: Secondary | ICD-10-CM | POA: Insufficient documentation

## 2017-12-08 DIAGNOSIS — L03313 Cellulitis of chest wall: Secondary | ICD-10-CM

## 2017-12-08 DIAGNOSIS — Y9389 Activity, other specified: Secondary | ICD-10-CM | POA: Insufficient documentation

## 2017-12-08 DIAGNOSIS — W57XXXA Bitten or stung by nonvenomous insect and other nonvenomous arthropods, initial encounter: Secondary | ICD-10-CM | POA: Insufficient documentation

## 2017-12-08 DIAGNOSIS — S20462A Insect bite (nonvenomous) of left back wall of thorax, initial encounter: Secondary | ICD-10-CM

## 2017-12-08 DIAGNOSIS — Y998 Other external cause status: Secondary | ICD-10-CM | POA: Insufficient documentation

## 2017-12-08 MED ORDER — CEPHALEXIN 500 MG PO CAPS: 500.0000 mg | ORAL_CAPSULE | Freq: Four times a day (QID) | ORAL | 0 refills | Status: DC

## 2017-12-08 MED ORDER — SULFAMETHOXAZOLE-TRIMETHOPRIM 800-160 MG PO TABS: 1.0000 | ORAL_TABLET | Freq: Two times a day (BID) | ORAL | 0 refills | Status: DC

## 2017-12-08 NOTE — ED Provider Notes (Signed)
[facility] EMERGENCY DEPARTMENT Provider Note   CSN: [id number] Arrival date & time: 12/08/17  1957     History   Chief Complaint Chief Complaint  Patient presents with  . Insect Bite    HPI Karl Johnson is a 27 y.o. male.  HPI Patient presents with 2-day history of redness and swelling to left flank.  Thinks there may have been an insect bite or spider bite.  Becoming more red and swollen.  States he does feel little fatigued.  States he has had a cough and occasional shortness of breath.No fevers.  No previous abscess. History reviewed. No pertinent past medical history.  There are no active problems to display for this patient.   History reviewed. No pertinent surgical history.      Home Medications    Prior to Admission medications   Medication Sig Start Date End Date Taking? Authorizing Provider  brompheniramine-pseudoephedrine-DM 30-2-10 MG/5ML syrup Take 5 mLs by mouth 4 (four) times daily as needed. Patient not taking: Reported on 11/30/2017 08/27/15   Joni Reining, PA-C  cephALEXin (KEFLEX) 500 MG capsule Take 1 capsule (500 mg total) by mouth 4 (four) times daily. 12/08/17   Benjiman Core, MD  chlorpheniramine (CHLOR-TRIMETON) 4 MG tablet Take 1 tablet (4 mg total) by mouth 2 (two) times daily as needed for allergies or rhinitis. Patient not taking: Reported on 11/30/2017 06/19/15   Evangeline Dakin, PA-C  cyclobenzaprine (FLEXERIL) 10 MG tablet Take 1 tablet (10 mg total) by mouth 3 (three) times daily as needed for muscle spasms. Patient not taking: Reported on 11/30/2017 09/30/16   Willy Eddy, MD  guaiFENesin-codeine 100-10 MG/5ML syrup Take 10 mLs by mouth every 4 (four) hours as needed for cough. Patient not taking: Reported on 11/30/2017 06/19/15   Beers, Charmayne Sheer, PA-C  ibuprofen (ADVIL,MOTRIN) 600 MG tablet Take 1 tablet (600 mg total) by mouth every 8 (eight) hours as needed. Patient not taking: Reported on 11/30/2017 04/24/16   Joni Reining,  PA-C  ibuprofen (ADVIL,MOTRIN) 800 MG tablet Take 1 tablet (800 mg total) by mouth every 8 (eight) hours as needed. Patient not taking: Reported on 11/30/2017 10/24/14   Phineas Semen, MD  ibuprofen (ADVIL,MOTRIN) 800 MG tablet Take 1 tablet (800 mg total) by mouth every 8 (eight) hours as needed for moderate pain. Patient not taking: Reported on 11/30/2017 08/27/15   Joni Reining, PA-C  ondansetron (ZOFRAN ODT) 4 MG disintegrating tablet Take 1 tablet (4 mg total) by mouth every 8 (eight) hours as needed for nausea or vomiting. 11/30/17   Jene Every, MD  ondansetron (ZOFRAN) 8 MG tablet Take 1.5 tablets (12 mg total) by mouth every 8 (eight) hours as needed for nausea or vomiting. Patient not taking: Reported on 11/30/2017 04/24/16   Joni Reining, PA-C  pseudoephedrine (SUDAFED) 60 MG tablet Take 1 tablet (60 mg total) by mouth every 4 (four) hours as needed for congestion. Patient not taking: Reported on 11/30/2017 06/19/15   Evangeline Dakin, PA-C  sulfamethoxazole-trimethoprim (BACTRIM DS,SEPTRA DS) 800-160 MG tablet Take 1 tablet by mouth 2 (two) times daily. 12/08/17   Benjiman Core, MD    Family History No family history on file.  Social History Social History   Tobacco Use  . Smoking status: Former Games developer  . Smokeless tobacco: Never Used  Substance Use Topics  . Alcohol use: No  . Drug use: Not on file     Allergies   Patient has no known allergies.  Review of Systems Review of Systems  Constitutional: Positive for fatigue. Negative for appetite change.  Respiratory: Positive for cough and shortness of breath.   Cardiovascular: Negative for chest pain.  Gastrointestinal: Negative for abdominal pain.  Musculoskeletal: Negative for back pain.  Skin: Positive for wound.  Neurological: Negative for weakness.     Physical Exam Updated Vital Signs BP (!) 142/71 (BP Location: Left Arm)   Pulse 81   Temp 98.4 F (36.9 C) (Temporal)   Resp 14   Ht 6\' 2"  (1.88  m)   Wt 95.3 kg (210 lb)   SpO2 99%   BMI 26.96 kg/m    Physical Exam  Constitutional: He appears well-developed.  HENT:  Head: Normocephalic.  Cardiovascular: Normal rate.  Pulmonary/Chest: He has no wheezes. He has no rales.  Musculoskeletal: He exhibits tenderness.  Skin:  Erythematous tender indurated approximately 4 cm x 2-1/2 cm oval-shaped with small central scab.  No fluctuance.  Area in his left flank.     ED Treatments / Results  Labs (all labs ordered are listed, but only abnormal results are displayed) Labs Reviewed - No data to display  EKG None  Radiology No results found.  Procedures Procedures (including critical care time)  Medications Ordered in ED Medications - No data to display   Initial Impression / Assessment and Plan / ED Course  I have reviewed the triage vital signs and the nursing notes.  Pertinent labs & imaging results that were available during my care of the patient were reviewed by me and considered in my medical decision making (see chart for details).     Patient with cellulitis of his left flank.  No abscess seen on bedside ultrasound.  Will treat with antibiotics.  Lungs are clear.  Discharge home.  Final Clinical Impressions(s) / ED Diagnoses   Final diagnoses:  Insect bite of left back wall of thorax, initial encounter  Cellulitis of chest wall    ED Discharge Orders        Ordered    sulfamethoxazole-trimethoprim (BACTRIM DS,SEPTRA DS) 800-160 MG tablet  2 times daily     12/08/17 2026    cephALEXin (KEFLEX) 500 MG capsule  4 times daily     12/08/17 2026      Benjiman CorePickering, Vikrant Pryce, MD 12/08/17 2039

## 2017-12-08 NOTE — ED Triage Notes (Signed)
Pt c/o insect bite to left side of back since Sunday. The area is red and hard. Pt states since the bite he has had a cough and slight sob.

## 2017-12-10 ENCOUNTER — Encounter: Payer: Self-pay | Admitting: Emergency Medicine

## 2017-12-10 ENCOUNTER — Emergency Department: Payer: Self-pay

## 2017-12-10 ENCOUNTER — Other Ambulatory Visit: Payer: Self-pay

## 2017-12-10 ENCOUNTER — Emergency Department
Admission: EM | Admit: 2017-12-10 | Discharge: 2017-12-10 | Disposition: A | Discharge status: Home / Self Care | Payer: Self-pay | Attending: Emergency Medicine | Admitting: Emergency Medicine

## 2017-12-10 DIAGNOSIS — Z87891 Personal history of nicotine dependence: Secondary | ICD-10-CM | POA: Insufficient documentation

## 2017-12-10 DIAGNOSIS — L03313 Cellulitis of chest wall: Secondary | ICD-10-CM | POA: Insufficient documentation

## 2017-12-10 DIAGNOSIS — R112 Nausea with vomiting, unspecified: Secondary | ICD-10-CM | POA: Insufficient documentation

## 2017-12-10 LAB — URINALYSIS, COMPLETE (UACMP) WITH MICROSCOPIC
Bacteria, UA: NONE SEEN
Bilirubin Urine: NEGATIVE
Glucose, UA: NEGATIVE mg/dL
Hgb urine dipstick: NEGATIVE
Ketones, ur: NEGATIVE mg/dL
Leukocytes, UA: NEGATIVE
Nitrite: NEGATIVE
Protein, ur: NEGATIVE mg/dL
Specific Gravity, Urine: 1.013 (ref 1.005–1.030)
WBC, UA: NONE SEEN WBC/hpf (ref 0–5)
pH: 6 (ref 5.0–8.0)

## 2017-12-10 LAB — CBC
HCT: 47.8 % (ref 40.0–52.0)
Hemoglobin: 16.4 g/dL (ref 13.0–18.0)
MCH: 29 pg (ref 26.0–34.0)
MCHC: 34.3 g/dL (ref 32.0–36.0)
MCV: 84.5 fL (ref 80.0–100.0)
Platelets: 327 10*3/uL (ref 150–440)
RBC: 5.66 MIL/uL (ref 4.40–5.90)
RDW: 12.9 % (ref 11.5–14.5)
WBC: 11 10*3/uL — ABNORMAL HIGH (ref 3.8–10.6)

## 2017-12-10 LAB — COMPREHENSIVE METABOLIC PANEL
ALT: 19 U/L (ref 0–44)
AST: 22 U/L (ref 15–41)
Albumin: 4.1 g/dL (ref 3.5–5.0)
Alkaline Phosphatase: 95 U/L (ref 38–126)
Anion gap: 9 (ref 5–15)
BUN: 11 mg/dL (ref 6–20)
CO2: 24 mmol/L (ref 22–32)
Calcium: 9.2 mg/dL (ref 8.9–10.3)
Chloride: 106 mmol/L (ref 98–111)
Creatinine, Ser: 0.9 mg/dL (ref 0.61–1.24)
GFR calc Af Amer: >60 mL/min (ref >60)
GFR calc non Af Amer: >60 mL/min (ref >60)
Glucose, Bld: 98 mg/dL (ref 70–99)
Potassium: 3.7 mmol/L (ref 3.5–5.1)
Sodium: 139 mmol/L (ref 135–145)
Total Bilirubin: 0.4 mg/dL (ref 0.3–1.2)
Total Protein: 7.2 g/dL (ref 6.5–8.1)

## 2017-12-10 LAB — LIPASE, BLOOD: Lipase: 35 U/L (ref 11–51)

## 2017-12-10 MED ORDER — ONDANSETRON 4 MG PO TBDP: 4.0000 mg | ORAL_TABLET | Freq: Three times a day (TID) | ORAL | 0 refills | Status: DC | PRN

## 2017-12-10 MED ORDER — ONDANSETRON 4 MG PO TBDP
8.0000 mg | ORAL_TABLET | Freq: Once | ORAL | Status: AC
  Administered 2017-12-10: 8 mg via ORAL
  Filled 2017-12-10: qty 2

## 2017-12-10 MED ORDER — GI COCKTAIL ~~LOC~~
30.0000 mL | ORAL | Status: AC
  Administered 2017-12-10: 30 mL via ORAL
  Filled 2017-12-10: qty 30

## 2017-12-10 MED ORDER — FAMOTIDINE 20 MG PO TABS: 20.0000 mg | ORAL_TABLET | Freq: Two times a day (BID) | ORAL | 0 refills | Status: DC

## 2017-12-10 MED ORDER — FAMOTIDINE 20 MG PO TABS
40.0000 mg | ORAL_TABLET | Freq: Once | ORAL | Status: AC
  Administered 2017-12-10: 40 mg via ORAL
  Filled 2017-12-10: qty 2

## 2017-12-10 NOTE — ED Provider Notes (Addendum)
[facility] Emergency Department Provider Note  ____________________________________________  Time seen: Approximately 6:37 AM  I have reviewed the triage vital signs and the nursing notes.   HISTORY  Chief Complaint Emesis and Fatigue    HPI Karl Johnson is a 27 y.o. male recently started treatment for a left chest wall cellulitis with Bactrim and Keflex 24 hours ago, who complains of nausea vomiting and malaise.  No body aches, headaches, dizziness or fever.  Started after he was taking antibiotics on an empty stomach.  Feels like the cellulitis area has not improved yet, but he is only been on the anabolic for 1 day.  No chest pain or shortness of breath.  Vomiting is intermittent, no aggravating or alleviating factors.      History reviewed. No pertinent past medical history.   There are no active problems to display for this patient.    History reviewed. No pertinent surgical history.   Prior to Admission medications   Medication Sig Start Date End Date Taking? Authorizing Provider  brompheniramine-pseudoephedrine-DM 30-2-10 MG/5ML syrup Take 5 mLs by mouth 4 (four) times daily as needed. Patient not taking: Reported on 11/30/2017 08/27/15   Joni Reining, PA-C  cephALEXin (KEFLEX) 500 MG capsule Take 1 capsule (500 mg total) by mouth 4 (four) times daily. 12/08/17   Benjiman Core, MD  chlorpheniramine (CHLOR-TRIMETON) 4 MG tablet Take 1 tablet (4 mg total) by mouth 2 (two) times daily as needed for allergies or rhinitis. Patient not taking: Reported on 11/30/2017 06/19/15   Evangeline Dakin, PA-C  cyclobenzaprine (FLEXERIL) 10 MG tablet Take 1 tablet (10 mg total) by mouth 3 (three) times daily as needed for muscle spasms. Patient not taking: Reported on 11/30/2017 09/30/16   Willy Eddy, MD  famotidine (PEPCID) 20 MG tablet Take 1 tablet (20 mg total) by mouth 2 (two) times daily. 12/10/17   Sharman Cheek, MD  guaiFENesin-codeine 100-10  MG/5ML syrup Take 10 mLs by mouth every 4 (four) hours as needed for cough. Patient not taking: Reported on 11/30/2017 06/19/15   Beers, Charmayne Sheer, PA-C  ibuprofen (ADVIL,MOTRIN) 600 MG tablet Take 1 tablet (600 mg total) by mouth every 8 (eight) hours as needed. Patient not taking: Reported on 11/30/2017 04/24/16   Joni Reining, PA-C  ibuprofen (ADVIL,MOTRIN) 800 MG tablet Take 1 tablet (800 mg total) by mouth every 8 (eight) hours as needed. Patient not taking: Reported on 11/30/2017 10/24/14   Phineas Semen, MD  ibuprofen (ADVIL,MOTRIN) 800 MG tablet Take 1 tablet (800 mg total) by mouth every 8 (eight) hours as needed for moderate pain. Patient not taking: Reported on 11/30/2017 08/27/15   Joni Reining, PA-C  ondansetron (ZOFRAN ODT) 4 MG disintegrating tablet Take 1 tablet (4 mg total) by mouth every 8 (eight) hours as needed for nausea or vomiting. 12/10/17   Sharman Cheek, MD  ondansetron (ZOFRAN) 8 MG tablet Take 1.5 tablets (12 mg total) by mouth every 8 (eight) hours as needed for nausea or vomiting. Patient not taking: Reported on 11/30/2017 04/24/16   Joni Reining, PA-C  pseudoephedrine (SUDAFED) 60 MG tablet Take 1 tablet (60 mg total) by mouth every 4 (four) hours as needed for congestion. Patient not taking: Reported on 11/30/2017 06/19/15   Evangeline Dakin, PA-C  sulfamethoxazole-trimethoprim (BACTRIM DS,SEPTRA DS) 800-160 MG tablet Take 1 tablet by mouth 2 (two) times daily. 12/08/17   Benjiman Core, MD     Allergies Patient has no known allergies.   No  family history on file.  Social History Social History   Tobacco Use  . Smoking status: Former Games developer  . Smokeless tobacco: Never Used  Substance Use Topics  . Alcohol use: No  . Drug use: Not on file    Review of Systems  Constitutional:   No fever or chills.  ENT:   No sore throat. No rhinorrhea. Cardiovascular:   No chest pain or syncope. Respiratory:   No dyspnea or cough. Gastrointestinal:   Negative  for abdominal pain, positive for vomiting Musculoskeletal:   Negative for focal pain or swelling All other systems reviewed and are negative except as documented above in ROS and HPI.  ____________________________________________   PHYSICAL EXAM:  VITAL SIGNS: ED Triage Vitals  Enc Vitals Group     BP 12/10/17 0455 129/73     Pulse Rate 12/10/17 0455 87     Resp 12/10/17 0455 20     Temp 12/10/17 0455 98.2 F (36.8 C)     Temp Source 12/10/17 0455 Oral     SpO2 12/10/17 0455 98 %     Weight 12/10/17 0458 210 lb (95.3 kg)     Height 12/10/17 0458 6\' 2"  (1.88 m)     Head Circumference --      Peak Flow --      Pain Score 12/10/17 0456 2     Pain Loc --      Pain Edu? --      Excl. in GC? --     Vital signs reviewed, nursing assessments reviewed.   Constitutional:   Alert and oriented. Non-toxic appearance. Eyes:   Conjunctivae are normal. EOMI. PERRL. ENT      Head:   Normocephalic and atraumatic.      Nose:   No congestion/rhinnorhea.       Mouth/Throat:   MMM, no pharyngeal erythema. No peritonsillar mass.       Neck:   No meningismus. Full ROM. Hematological/Lymphatic/Immunilogical:   No cervical lymphadenopathy. Cardiovascular:   RRR. Symmetric bilateral radial and DP pulses.  No murmurs.  Respiratory:   Normal respiratory effort without tachypnea/retractions. Breath sounds are clear and equal bilaterally. No wheezes/rales/rhonchi. Gastrointestinal:   Soft and nontender. Non distended. There is no CVA tenderness.  No rebound, rigidity, or guarding. Musculoskeletal:   Normal range of motion in all extremities. No joint effusions.  No lower extremity tenderness.  No edema. Neurologic:   Normal speech and language.  Motor grossly intact. No acute focal neurologic deficits are appreciated.  Skin:    Skin is warm, dry and intact. No rash noted.  No petechiae, purpura, or bullae.  ____________________________________________    LABS (pertinent  positives/negatives) (all labs ordered are listed, but only abnormal results are displayed) Labs Reviewed  CBC - Abnormal; Notable for the following components:      Result Value   WBC 11.0 (*)    All other components within normal limits  URINALYSIS, COMPLETE (UACMP) WITH MICROSCOPIC - Abnormal; Notable for the following components:   Color, Urine YELLOW (*)    APPearance CLEAR (*)    All other components within normal limits  LIPASE, BLOOD  COMPREHENSIVE METABOLIC PANEL   ____________________________________________   EKG    ____________________________________________    RADIOLOGY  Dg Chest 2 View  Result Date: 12/10/2017 CLINICAL DATA:  27 year old male with cough and fever. EXAM: CHEST - 2 VIEW COMPARISON:  Chest radiograph dated 09/30/2016 FINDINGS: The heart size and mediastinal contours are within normal limits. Both lungs are clear.  The visualized skeletal structures are unremarkable. IMPRESSION: No active cardiopulmonary disease. Electronically Signed   By: Elgie CollardArash  Radparvar M.D.   On: 12/10/2017 05:43    ____________________________________________   PROCEDURES Procedures  ____________________________________________    CLINICAL IMPRESSION / ASSESSMENT AND PLAN / ED COURSE  Pertinent labs & imaging results that were available during my care of the patient were reviewed by me and considered in my medical decision making (see chart for details).    Patient well-appearing, nontoxic, unremarkable vital signs, complains of vomiting in setting of taking antibiotics for gum infection.  Overall symptoms are minor, labs are normal, vital signs unremarkable, suitable for outpatient follow-up.Considering the patient's symptoms, medical history, and physical examination today, I have low suspicion for cholecystitis or biliary pathology, pancreatitis, perforation or bowel obstruction, hernia, intra-abdominal abscess, AAA or dissection, volvulus or intussusception,  mesenteric ischemia, or appendicitis.  Most likely GERD and gastritis from antibiotic irritation.  Abdominal exam is benign.      ____________________________________________   FINAL CLINICAL IMPRESSION(S) / ED DIAGNOSES    Final diagnoses:  Non-intractable vomiting with nausea, unspecified vomiting type  Cellulitis of chest wall     ED Discharge Orders        Ordered    famotidine (PEPCID) 20 MG tablet  2 times daily     12/10/17 0637    ondansetron (ZOFRAN ODT) 4 MG disintegrating tablet  Every 8 hours PRN     12/10/17 [id number]      Portions of this note were generated with dragon dictation software. Dictation errors may occur despite best attempts at proofreading.    Sharman CheekStafford, Dayani Winbush, MD 12/10/17 [id number]    Sharman CheekStafford, Ariyan Sinnett, MD 12/10/17 [phone]

## 2017-12-10 NOTE — ED Triage Notes (Signed)
Patient to ER for c/o N/V/D (2 episodes of emesis, 1 episode of diarrhea) since starting antibiotics for presumed insect bite to left rib area. Patient states "yesterday I slept all day and couldn't stay awake". Noticed "bite" area on Sunday. [facility] and prescribed antibiotics. [facility], states cough is same as when seen previously. Denies any known fevers. Patient has swollen red area to left rib area.

## 2018-02-04 ENCOUNTER — Encounter: Payer: Self-pay | Admitting: Emergency Medicine

## 2018-02-04 ENCOUNTER — Other Ambulatory Visit: Payer: Self-pay

## 2018-02-04 ENCOUNTER — Emergency Department: Payer: Managed Care, Other (non HMO)

## 2018-02-04 ENCOUNTER — Emergency Department
Admission: EM | Admit: 2018-02-04 | Discharge: 2018-02-04 | Disposition: A | Discharge status: Home / Self Care | Payer: Managed Care, Other (non HMO) | Attending: Emergency Medicine | Admitting: Emergency Medicine

## 2018-02-04 DIAGNOSIS — Z87891 Personal history of nicotine dependence: Secondary | ICD-10-CM | POA: Insufficient documentation

## 2018-02-04 DIAGNOSIS — Y9389 Activity, other specified: Secondary | ICD-10-CM | POA: Insufficient documentation

## 2018-02-04 DIAGNOSIS — X500XXA Overexertion from strenuous movement or load, initial encounter: Secondary | ICD-10-CM | POA: Diagnosis not present

## 2018-02-04 DIAGNOSIS — Z79899 Other long term (current) drug therapy: Secondary | ICD-10-CM | POA: Insufficient documentation

## 2018-02-04 DIAGNOSIS — S39012A Strain of muscle, fascia and tendon of lower back, initial encounter: Secondary | ICD-10-CM | POA: Insufficient documentation

## 2018-02-04 DIAGNOSIS — Y990 Civilian activity done for income or pay: Secondary | ICD-10-CM | POA: Diagnosis not present

## 2018-02-04 DIAGNOSIS — S3992XA Unspecified injury of lower back, initial encounter: Secondary | ICD-10-CM | POA: Diagnosis present

## 2018-02-04 DIAGNOSIS — Y929 Unspecified place or not applicable: Secondary | ICD-10-CM | POA: Diagnosis not present

## 2018-02-04 LAB — CBC WITH DIFFERENTIAL/PLATELET
Basophils Absolute: 0 10*3/uL (ref 0–0.1)
Basophils Relative: 0 %
Eosinophils Absolute: 0.5 10*3/uL (ref 0–0.7)
Eosinophils Relative: 6 %
HCT: 45.9 % (ref 40.0–52.0)
Hemoglobin: 16 g/dL (ref 13.0–18.0)
Lymphocytes Relative: 19 %
Lymphs Abs: 1.6 10*3/uL (ref 1.0–3.6)
MCH: 28.9 pg (ref 26.0–34.0)
MCHC: 34.8 g/dL (ref 32.0–36.0)
MCV: 83.1 fL (ref 80.0–100.0)
Monocytes Absolute: 0.5 10*3/uL (ref 0.2–1.0)
Monocytes Relative: 6 %
Neutro Abs: 6.1 10*3/uL (ref 1.4–6.5)
Neutrophils Relative %: 69 %
Platelets: 297 10*3/uL (ref 150–440)
RBC: 5.53 MIL/uL (ref 4.40–5.90)
RDW: 12.9 % (ref 11.5–14.5)
WBC: 8.8 10*3/uL (ref 3.8–10.6)

## 2018-02-04 LAB — URINALYSIS, COMPLETE (UACMP) WITH MICROSCOPIC
Bacteria, UA: NONE SEEN
Bilirubin Urine: NEGATIVE
Glucose, UA: NEGATIVE mg/dL
Hgb urine dipstick: NEGATIVE
Ketones, ur: NEGATIVE mg/dL
Leukocytes, UA: NEGATIVE
Nitrite: NEGATIVE
Protein, ur: 30 mg/dL — AB
Specific Gravity, Urine: 1.029 (ref 1.005–1.030)
pH: 6 (ref 5.0–8.0)

## 2018-02-04 LAB — COMPREHENSIVE METABOLIC PANEL
ALT: 24 U/L (ref 0–44)
AST: 25 U/L (ref 15–41)
Albumin: 4.5 g/dL (ref 3.5–5.0)
Alkaline Phosphatase: 78 U/L (ref 38–126)
Anion gap: 7 (ref 5–15)
BUN: 13 mg/dL (ref 6–20)
CO2: 28 mmol/L (ref 22–32)
Calcium: 8.8 mg/dL — ABNORMAL LOW (ref 8.9–10.3)
Chloride: 104 mmol/L (ref 98–111)
Creatinine, Ser: 0.96 mg/dL (ref 0.61–1.24)
GFR calc Af Amer: >60 mL/min (ref >60)
GFR calc non Af Amer: >60 mL/min (ref >60)
Glucose, Bld: 111 mg/dL — ABNORMAL HIGH (ref 70–99)
Potassium: 4.4 mmol/L (ref 3.5–5.1)
Sodium: 139 mmol/L (ref 135–145)
Total Bilirubin: 1.3 mg/dL — ABNORMAL HIGH (ref 0.3–1.2)
Total Protein: 7.3 g/dL (ref 6.5–8.1)

## 2018-02-04 LAB — LIPASE, BLOOD: Lipase: 27 U/L (ref 11–51)

## 2018-02-04 MED ORDER — HYDROCODONE-ACETAMINOPHEN 5-325 MG PO TABS: 1.0000 | ORAL_TABLET | Freq: Four times a day (QID) | ORAL | 0 refills | Status: DC | PRN

## 2018-02-04 MED ORDER — LIDOCAINE 5 % EX PTCH
1.0000 | MEDICATED_PATCH | Freq: Once | CUTANEOUS | Status: DC
  Administered 2018-02-04: 1 via TRANSDERMAL
  Filled 2018-02-04: qty 1

## 2018-02-04 MED ORDER — DICLOFENAC SODIUM 1 % TD GEL: 4.0000 g | Freq: Four times a day (QID) | TRANSDERMAL | 0 refills | Status: DC | PRN

## 2018-02-04 MED ORDER — IBUPROFEN 600 MG PO TABS
600.0000 mg | ORAL_TABLET | Freq: Once | ORAL | Status: AC
  Administered 2018-02-04: 600 mg via ORAL
  Filled 2018-02-04: qty 1

## 2018-02-04 NOTE — ED Notes (Signed)
Pt denies N/V. RR equal and unlabored; pt is resting with NAD noted.

## 2018-02-04 NOTE — ED Provider Notes (Signed)
[facility] Emergency Department Provider Note  ____________________________________________   First MD Initiated Contact with Patient 02/04/18 0710     (approximate)  I have reviewed the triage vital signs and the nursing notes.   HISTORY  Chief Complaint Flank Pain   HPI Karl SpangleJoshua Johnson is a 27 y.o. male self presents to the emergency department with left flank pain radiating to his left lower abdomen that began suddenly  earlier today while at work and lifting heavy objects.  He has no dysuria or hematuria.  No fevers or chills.  No history of same.  The pain is moderate severity worse when lifting improved with rest.  Normal perianal sensation.  No difficulty initiating urination or defecation.  He has taken no medications for the pain.   History reviewed. No pertinent past medical history.  There are no active problems to display for this patient.   History reviewed. No pertinent surgical history.  Prior to Admission medications   Medication Sig Start Date End Date Taking? Authorizing Provider  brompheniramine-pseudoephedrine-DM 30-2-10 MG/5ML syrup Take 5 mLs by mouth 4 (four) times daily as needed. Patient not taking: Reported on 11/30/2017 08/27/15   Joni ReiningSmith, Ronald K, PA-C  cephALEXin (KEFLEX) 500 MG capsule Take 1 capsule (500 mg total) by mouth 4 (four) times daily. 12/08/17   Benjiman CorePickering, Nathan, MD  chlorpheniramine (CHLOR-TRIMETON) 4 MG tablet Take 1 tablet (4 mg total) by mouth 2 (two) times daily as needed for allergies or rhinitis. Patient not taking: Reported on 11/30/2017 06/19/15   Evangeline DakinBeers, Charles M, PA-C  cyclobenzaprine (FLEXERIL) 10 MG tablet Take 1 tablet (10 mg total) by mouth 3 (three) times daily as needed for muscle spasms. Patient not taking: Reported on 11/30/2017 09/30/16   Willy Eddyobinson, Patrick, MD  diclofenac sodium (VOLTAREN) 1 % GEL Apply 4 g topically 4 (four) times daily as needed (pain). 02/04/18   Merrily Brittleifenbark, Ayodeji Keimig, MD  famotidine  (PEPCID) 20 MG tablet Take 1 tablet (20 mg total) by mouth 2 (two) times daily. 12/10/17   Sharman CheekStafford, Phillip, MD  guaiFENesin-codeine 100-10 MG/5ML syrup Take 10 mLs by mouth every 4 (four) hours as needed for cough. Patient not taking: Reported on 11/30/2017 06/19/15   Beers, Charmayne Sheerharles M, PA-C  HYDROcodone-acetaminophen (NORCO) 5-325 MG tablet Take 1 tablet by mouth every 6 (six) hours as needed for up to 7 doses for severe pain. 02/04/18   Merrily Brittleifenbark, Debany Vantol, MD  ibuprofen (ADVIL,MOTRIN) 600 MG tablet Take 1 tablet (600 mg total) by mouth every 8 (eight) hours as needed. Patient not taking: Reported on 11/30/2017 04/24/16   Joni ReiningSmith, Ronald K, PA-C  ibuprofen (ADVIL,MOTRIN) 800 MG tablet Take 1 tablet (800 mg total) by mouth every 8 (eight) hours as needed. Patient not taking: Reported on 11/30/2017 10/24/14   Phineas SemenGoodman, Graydon, MD  ibuprofen (ADVIL,MOTRIN) 800 MG tablet Take 1 tablet (800 mg total) by mouth every 8 (eight) hours as needed for moderate pain. Patient not taking: Reported on 11/30/2017 08/27/15   Joni ReiningSmith, Ronald K, PA-C  ondansetron (ZOFRAN ODT) 4 MG disintegrating tablet Take 1 tablet (4 mg total) by mouth every 8 (eight) hours as needed for nausea or vomiting. 12/10/17   Sharman CheekStafford, Phillip, MD  ondansetron (ZOFRAN) 8 MG tablet Take 1.5 tablets (12 mg total) by mouth every 8 (eight) hours as needed for nausea or vomiting. Patient not taking: Reported on 11/30/2017 04/24/16   Joni ReiningSmith, Ronald K, PA-C  pseudoephedrine (SUDAFED) 60 MG tablet Take 1 tablet (60 mg total) by mouth every 4 (  four) hours as needed for congestion. Patient not taking: Reported on 11/30/2017 06/19/15   Evangeline Dakin, PA-C  sulfamethoxazole-trimethoprim (BACTRIM DS,SEPTRA DS) 800-160 MG tablet Take 1 tablet by mouth 2 (two) times daily. 12/08/17   Benjiman Core, MD    Allergies Patient has no known allergies.  No family history on file.  Social History Social History   Tobacco Use  . Smoking status: Former Games developer  . Smokeless  tobacco: Never Used  Substance Use Topics  . Alcohol use: No  . Drug use: Not on file    Review of Systems Constitutional: No fever/chills Eyes: No visual changes. ENT: No sore throat. Cardiovascular: Denies chest pain. Respiratory: Denies shortness of breath. Gastrointestinal: Positive for abdominal pain.  No nausea, no vomiting.  No diarrhea.  No constipation. Genitourinary: Negative for dysuria. Musculoskeletal: Positive for back pain. Skin: Negative for rash. Neurological: Negative for headaches, focal weakness or numbness.   ____________________________________________   PHYSICAL EXAM:  VITAL SIGNS: ED Triage Vitals  Enc Vitals Group     BP 02/04/18 0641 119/61     Pulse Rate 02/04/18 0641 76     Resp 02/04/18 0641 18     Temp 02/04/18 0641 98.6 F (37 C)     Temp Source 02/04/18 0641 Oral     SpO2 02/04/18 0641 97 %     Weight 02/04/18 0639 210 lb (95.3 kg)     Height 02/04/18 0639 6\' 2"  (1.88 m)     Head Circumference --      Peak Flow --      Pain Score 02/04/18 0639 6     Pain Loc --      Pain Edu? --      Excl. in GC? --     Constitutional: Alert and oriented x4 appears somewhat uncomfortable though nontoxic no diaphoresis Eyes: PERRL EOMI. Head: Atraumatic. Nose: No congestion/rhinnorhea. Mouth/Throat: No trismus Neck: No stridor.   Cardiovascular: Normal rate, regular rhythm. Grossly normal heart sounds.  Good peripheral circulation. Respiratory: Normal respiratory effort.  No retractions. Lungs CTAB and moving good air Gastrointestinal: Soft nontender no peritonitis no costovertebral tenderness Musculoskeletal: No lower extremity edema   Quite tender left lumbar paraspinal spasm Neurologic:  Normal speech and language. No gross focal neurologic deficits are appreciated. Skin:  Skin is warm, dry and intact. No rash noted. Psychiatric: Mood and affect are normal. Speech and behavior are normal.    ____________________________________________     DIFFERENTIAL includes but not limited to  Kidney stone, pyelonephritis, muscle strain ____________________________________________   LABS (all labs ordered are listed, but only abnormal results are displayed)  Labs Reviewed  COMPREHENSIVE METABOLIC PANEL - Abnormal; Notable for the following components:      Result Value   Glucose, Bld 111 (*)    Calcium 8.8 (*)    Total Bilirubin 1.3 (*)    All other components within normal limits  URINALYSIS, COMPLETE (UACMP) WITH MICROSCOPIC - Abnormal; Notable for the following components:   Color, Urine AMBER (*)    APPearance CLEAR (*)    Protein, ur 30 (*)    All other components within normal limits  CBC WITH DIFFERENTIAL/PLATELET  LIPASE, BLOOD    Lab work reviewed by me with slight proteinuria otherwise unremarkable __________________________________________  EKG   ____________________________________________  RADIOLOGY  CT stone protocol reviewed by me with no acute disease ____________________________________________   PROCEDURES  Procedure(s) performed: no  Procedures  Critical Care performed: no  ____________________________________________   INITIAL IMPRESSION /  ASSESSMENT AND PLAN / ED COURSE  Pertinent labs & imaging results that were available during my care of the patient were reviewed by me and considered in my medical decision making (see chart for details).   As part of my medical decision making, I reviewed the following data within the electronic MEDICAL RECORD NUMBER History obtained from family if available, nursing notes, old chart and ekg, as well as notes from prior ED visits.  Patient comes the emergency department with left flank pain radiating towards his groin.  Differential is broad but includes nephrolithiasis versus musculoskeletal.  CT stone reviewed by me with no evidence of stone.  The patient feels improved after Motrin.  His pain could very well still be from a kidney stone but is too small  to visualize on the CT, however with no hematuria and with the positional nature of his pain it likely is musculoskeletal.  I will treat him with a short course of hydrocodone and diclofenac gel and refer him back to primary care.      ____________________________________________   FINAL CLINICAL IMPRESSION(S) / ED DIAGNOSES  Final diagnoses:  Strain of lumbar region, initial encounter      NEW MEDICATIONS STARTED DURING THIS VISIT:  Discharge Medication List as of 02/04/2018  8:03 AM    START taking these medications   Details  diclofenac sodium (VOLTAREN) 1 % GEL Apply 4 g topically 4 (four) times daily as needed (pain)., Starting Thu 02/04/2018, Print    HYDROcodone-acetaminophen (NORCO) 5-325 MG tablet Take 1 tablet by mouth every 6 (six) hours as needed for up to 7 doses for severe pain., Starting Thu 02/04/2018, Print         Note:  This document was prepared using Dragon voice recognition software and may include unintentional dictation errors.     Merrily Brittle, MD 02/06/18 2200

## 2018-02-04 NOTE — ED Triage Notes (Signed)
Patient ambulatory to triage with steady gait, without difficulty or distress noted; pt reports left flank pain radiating around into left side abd this morning with no accomp symptoms; denies hx of same

## 2018-02-04 NOTE — ED Notes (Signed)
Report given to oncoming shift NeurosurgeonMegan RN. Patient in stable condition prior. Patient care transferred.

## 2018-02-04 NOTE — Discharge Instructions (Signed)
It was a pleasure to take care of you today, and thank you for coming to our emergency department.  If you have any questions or concerns before leaving please ask the nurse to grab me and I'm more than happy to go through your aftercare instructions again.  If you were prescribed any opioid pain medication today such as Norco, Vicodin, Percocet, morphine, hydrocodone, or oxycodone please make sure you do not drive when you are taking this medication as it can alter your ability to drive safely.  If you have any concerns once you are home that you are not improving or are in fact getting worse before you can make it to your follow-up appointment, please do not hesitate to call [phone] and come back for further evaluation.  Merrily Brittle, MD  Results for orders placed or performed during the hospital encounter of 02/04/18  CBC with Differential  Result Value Ref Range   WBC 8.8 3.8 - 10.6 K/uL   RBC 5.53 4.40 - 5.90 MIL/uL   Hemoglobin 16.0 13.0 - 18.0 g/dL   HCT 16.1 09.6 - 04.5 %   MCV 83.1 80.0 - 100.0 fL   MCH 28.9 26.0 - 34.0 pg   MCHC 34.8 32.0 - 36.0 g/dL   RDW 40.9 81.1 - 91.4 %   Platelets 297 150 - 440 K/uL   Neutrophils Relative % 69 %   Neutro Abs 6.1 1.4 - 6.5 K/uL   Lymphocytes Relative 19 %   Lymphs Abs 1.6 1.0 - 3.6 K/uL   Monocytes Relative 6 %   Monocytes Absolute 0.5 0.2 - 1.0 K/uL   Eosinophils Relative 6 %   Eosinophils Absolute 0.5 0 - 0.7 K/uL   Basophils Relative 0 %   Basophils Absolute 0.0 0 - 0.1 K/uL  Comprehensive metabolic panel  Result Value Ref Range   Sodium 139 135 - 145 mmol/L   Potassium 4.4 3.5 - 5.1 mmol/L   Chloride 104 98 - 111 mmol/L   CO2 28 22 - 32 mmol/L   Glucose, Bld 111 (H) 70 - 99 mg/dL   BUN 13 6 - 20 mg/dL   Creatinine, Ser 7.82 0.61 - 1.24 mg/dL   Calcium 8.8 (L) 8.9 - 10.3 mg/dL   Total Protein 7.3 6.5 - 8.1 g/dL   Albumin 4.5 3.5 - 5.0 g/dL   AST 25 15 - 41 U/L   ALT 24 0 - 44 U/L   Alkaline Phosphatase 78 38 - 126 U/L   Total Bilirubin 1.3 (H) 0.3 - 1.2 mg/dL   GFR calc non Af Amer >60 >60 mL/min   GFR calc Af Amer >60 >60 mL/min   Anion gap 7 5 - 15  Lipase, blood  Result Value Ref Range   Lipase 27 11 - 51 U/L  Urinalysis, Complete w Microscopic  Result Value Ref Range   Color, Urine AMBER (A) YELLOW   APPearance CLEAR (A) CLEAR   Specific Gravity, Urine 1.029 1.005 - 1.030   pH 6.0 5.0 - 8.0   Glucose, UA NEGATIVE NEGATIVE mg/dL   Hgb urine dipstick NEGATIVE NEGATIVE   Bilirubin Urine NEGATIVE NEGATIVE   Ketones, ur NEGATIVE NEGATIVE mg/dL   Protein, ur 30 (A) NEGATIVE mg/dL   Nitrite NEGATIVE NEGATIVE   Leukocytes, UA NEGATIVE NEGATIVE   RBC / HPF 0-5 0 - 5 RBC/hpf   WBC, UA 0-5 0 - 5 WBC/hpf   Bacteria, UA NONE SEEN NONE SEEN   Squamous Epithelial / LPF 0-5 0 - 5  Mucus PRESENT    Ct Renal Stone Study  Result Date: 02/04/2018 CLINICAL DATA:  Left flank pain with stone disease suspected. EXAM: CT ABDOMEN AND PELVIS WITHOUT CONTRAST TECHNIQUE: Multidetector CT imaging of the abdomen and pelvis was performed following the standard protocol without IV contrast. COMPARISON:  None. FINDINGS: Lower chest:  No contributory findings. Hepatobiliary: No focal liver abnormality.No evidence of biliary obstruction or stone. Pancreas: Unremarkable. Spleen: Unremarkable. Adrenals/Urinary Tract: Negative adrenals. No hydronephrosis or stone. Unremarkable bladder. Stomach/Bowel:  No obstruction. No appendicitis. Vascular/Lymphatic: No acute vascular abnormality. No mass or adenopathy. Reproductive:No pathologic findings. Other: No ascites or pneumoperitoneum. Musculoskeletal: No acute abnormalities. No evident impingement to explain left flank pain. IMPRESSION: Negative.  No hydronephrosis or urolithiasis. Electronically Signed   By: Marnee SpringJonathon  Watts M.D.   On: 02/04/2018 07:51

## 2018-05-28 DIAGNOSIS — J40 Bronchitis, not specified as acute or chronic: Secondary | ICD-10-CM | POA: Diagnosis not present

## 2018-05-28 DIAGNOSIS — J019 Acute sinusitis, unspecified: Secondary | ICD-10-CM | POA: Diagnosis not present

## 2018-05-28 DIAGNOSIS — B9689 Other specified bacterial agents as the cause of diseases classified elsewhere: Secondary | ICD-10-CM | POA: Diagnosis not present

## 2018-07-06 DIAGNOSIS — R05 Cough: Secondary | ICD-10-CM | POA: Diagnosis not present

## 2018-07-06 DIAGNOSIS — R6889 Other general symptoms and signs: Secondary | ICD-10-CM | POA: Diagnosis not present

## 2018-08-26 IMAGING — CR DG CHEST 2V
2 series · 2 of 2 positions shown · non-contrast
Comparison: Chest radiograph dated 09/30/2016

CLINICAL DATA: 26-year-old male with cough and fever.

EXAM:
CHEST - 2 VIEW

[chest pa]
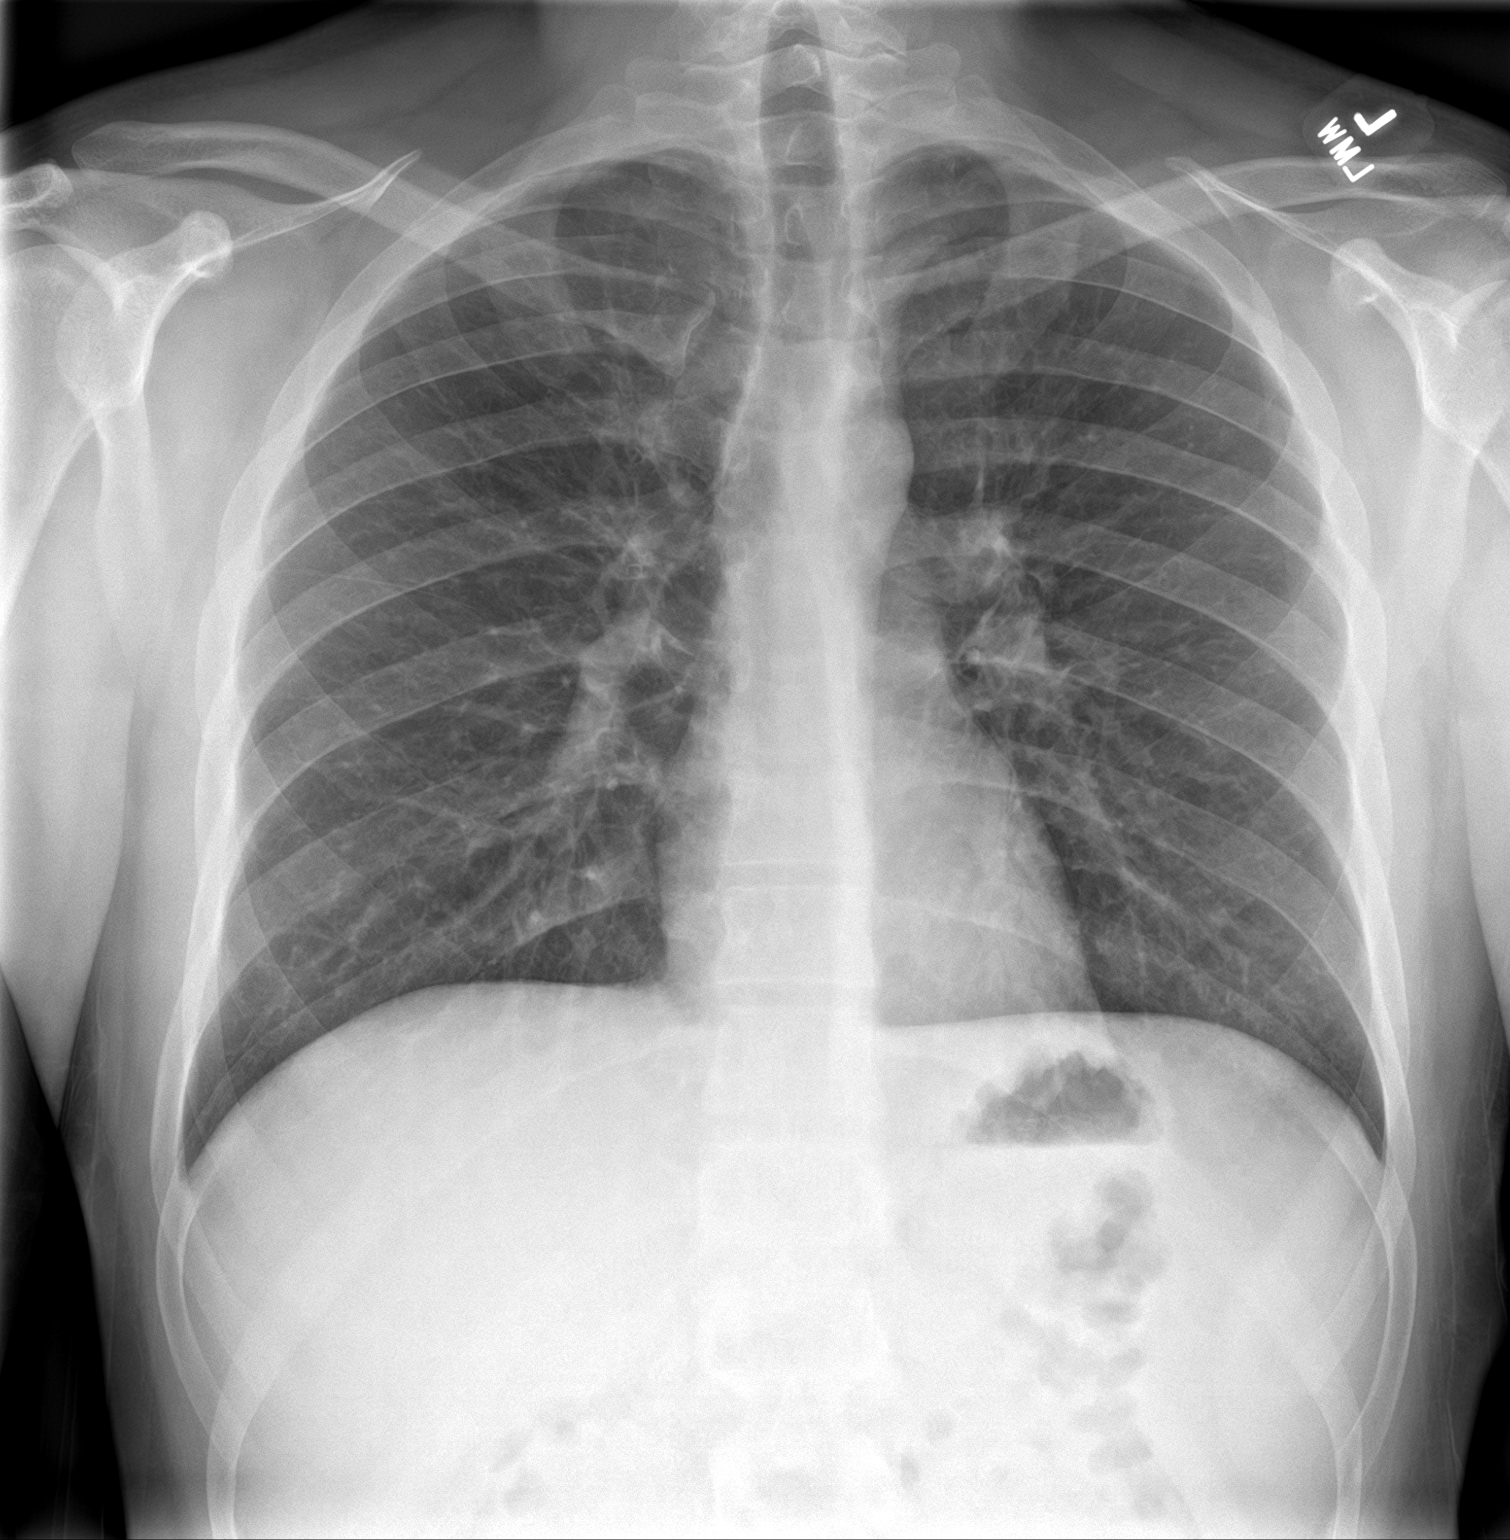

[chest lat]
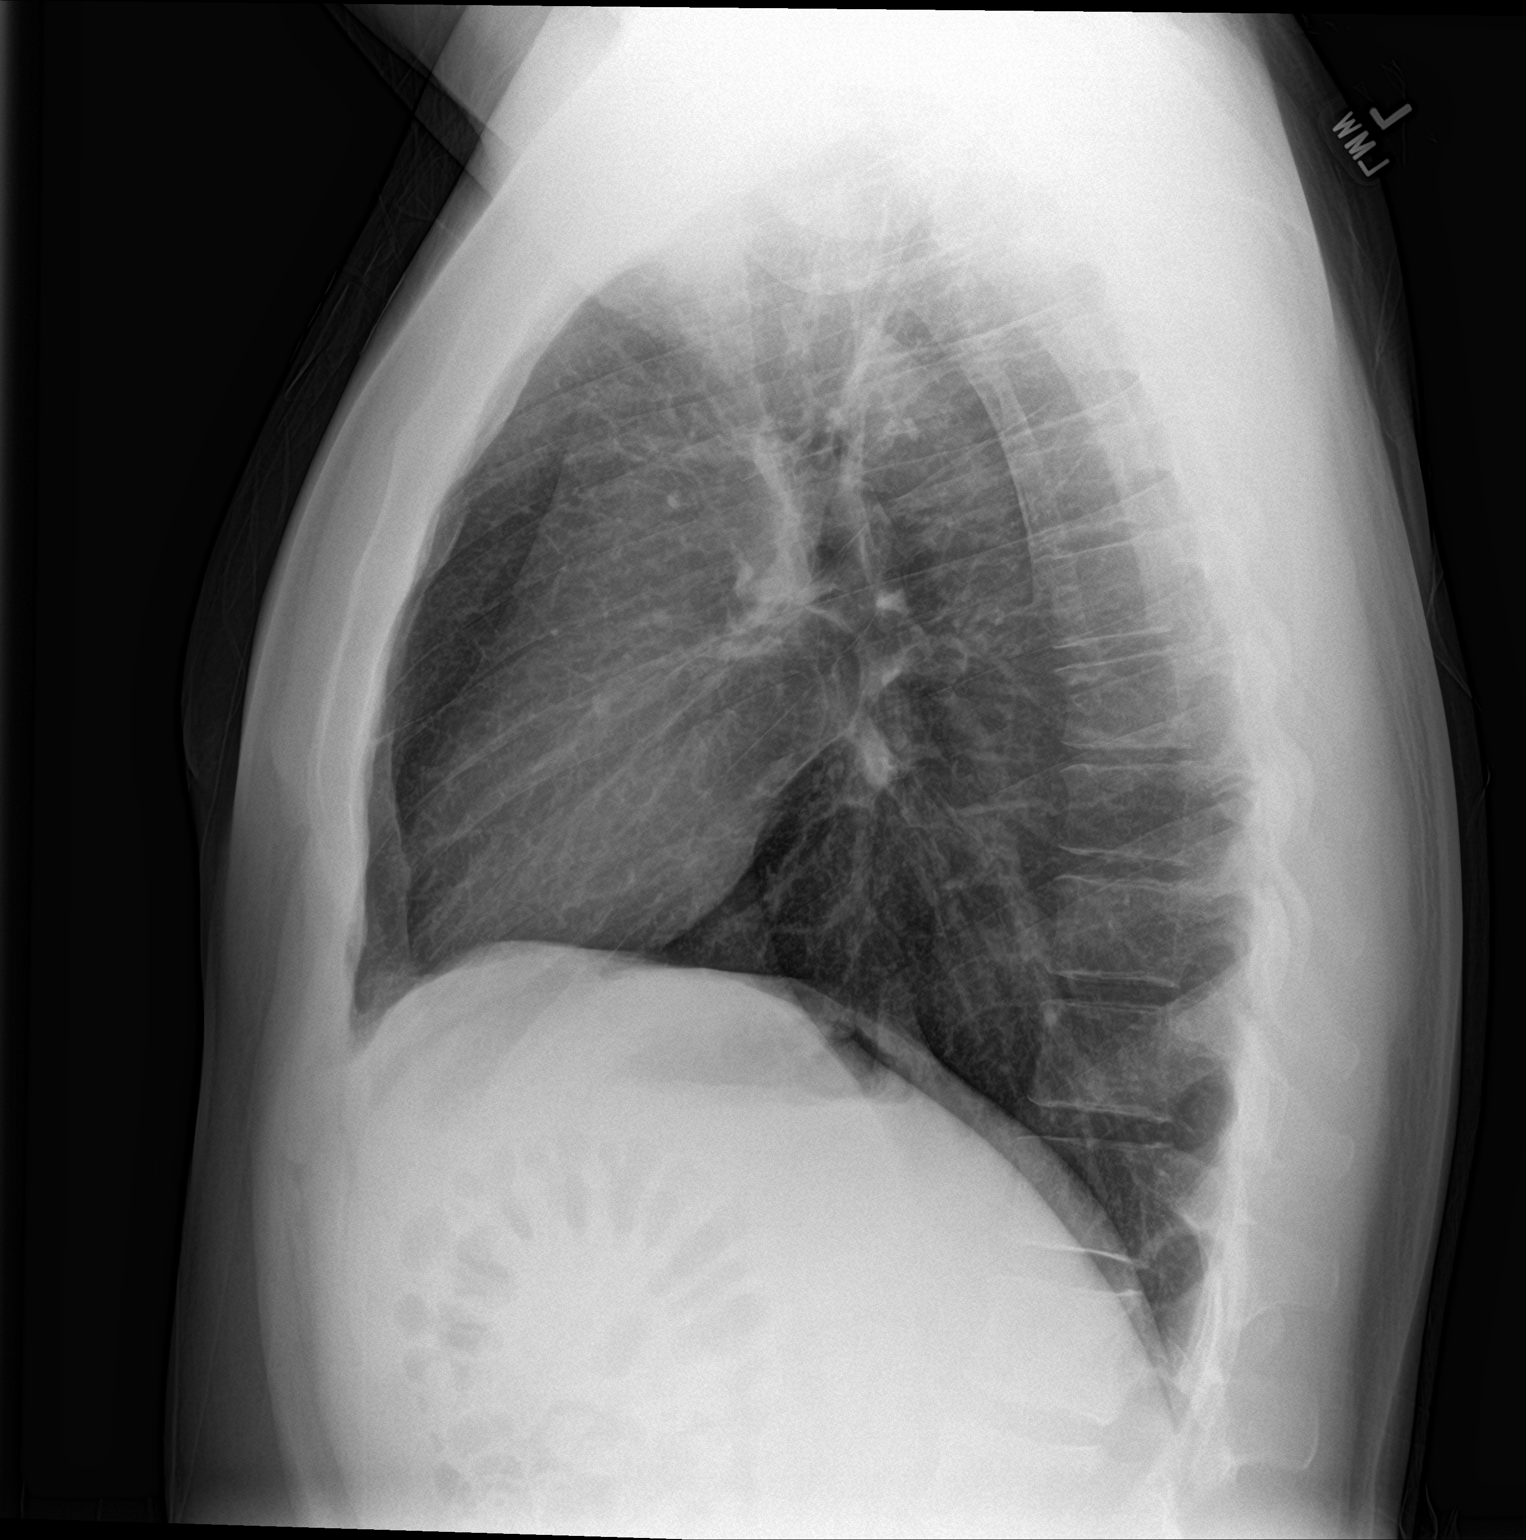

[2 of 2 positions shown; findings below may reference images not displayed]

FINDINGS: The heart size and mediastinal contours are within normal limits.
Both lungs are clear. The visualized skeletal structures are
unremarkable.
IMPRESSION: No active cardiopulmonary disease.

## 2019-06-14 ENCOUNTER — Other Ambulatory Visit: Payer: Self-pay

## 2019-06-14 ENCOUNTER — Ambulatory Visit: Payer: BC Managed Care – PPO | Attending: Internal Medicine

## 2019-06-14 DIAGNOSIS — Z20822 Contact with and (suspected) exposure to covid-19: Secondary | ICD-10-CM

## 2019-06-15 LAB — NOVEL CORONAVIRUS, NAA: SARS-CoV-2, NAA: NOT DETECTED

## 2019-06-20 ENCOUNTER — Telehealth: Payer: Self-pay

## 2019-06-20 NOTE — Telephone Encounter (Signed)
Patient needs result faxed to [facility] Fax: [phone] Attn: Hans Eden

## 2019-08-20 ENCOUNTER — Other Ambulatory Visit: Payer: Self-pay

## 2019-08-20 ENCOUNTER — Ambulatory Visit
Admission: EM | Admit: 2019-08-20 | Discharge: 2019-08-20 | Disposition: A | Discharge status: Home / Self Care | Payer: BC Managed Care – PPO | Attending: Emergency Medicine | Admitting: Emergency Medicine

## 2019-08-20 DIAGNOSIS — M7581 Other shoulder lesions, right shoulder: Secondary | ICD-10-CM

## 2019-08-20 DIAGNOSIS — M25511 Pain in right shoulder: Secondary | ICD-10-CM

## 2019-08-20 MED ORDER — PREDNISONE 10 MG (21) PO TBPK: ORAL_TABLET | Freq: Every day | ORAL | 0 refills | Status: DC

## 2019-08-20 MED ORDER — CYCLOBENZAPRINE HCL 10 MG PO TABS: 10.0000 mg | ORAL_TABLET | Freq: Every day | ORAL | 0 refills | Status: DC

## 2019-08-20 NOTE — Discharge Instructions (Signed)
Continue conservative management of rest, ice, and gentle stretches Avoid painful activities.  Avoid heavy overhead lifting, use a partner, both upper extremities and trunk with lifting heavy objects Prednisone prescribed.  Take as directed and to completion Take cyclobenzaprine at nighttime for symptomatic relief. Avoid driving or operating heavy machinery while using medication. Follow up with PCP if symptoms persist Return or go to the ER if you have any new or worsening symptoms (fever, chills, chest pain, shortness of breath, worsening symptoms despite treatment, etc...)

## 2019-08-20 NOTE — ED Provider Notes (Signed)
[facility]   [id number] 08/20/19 Arrival Time: 7340  CC: RT shoulder pain/ injury  SUBJECTIVE: History from: patient. Karl Johnson is a 29 y.o. male complains of RT shoulder pain x 1 week.  Symptoms began after lifting metal rods with RT arm and placing them on top of RT shoulder for transport.  Localizes the pain to the back and front of RT shoulder.  Describes the pain as intermittent and achy in character.  Has tried OTC medications without relief.  Symptoms are made worse with shoulder ROM.  Denies similar symptoms in the past.  Complains of swelling to back of shoulder.  Denies fever, chills, erythema, ecchymosis, effusion, weakness, numbness and tingling.        ROS: As per HPI.  All other pertinent ROS negative.     History reviewed. No pertinent past medical history. History reviewed. No pertinent surgical history. No Known Allergies No current facility-administered medications on file prior to encounter.   Current Outpatient Medications on File Prior to Encounter  Medication Sig Dispense Refill  . diclofenac sodium (VOLTAREN) 1 % GEL Apply 4 g topically 4 (four) times daily as needed (pain). 100 g 0  . famotidine (PEPCID) 20 MG tablet Take 1 tablet (20 mg total) by mouth 2 (two) times daily. 60 tablet 0  . [DISCONTINUED] chlorpheniramine (CHLOR-TRIMETON) 4 MG tablet Take 1 tablet (4 mg total) by mouth 2 (two) times daily as needed for allergies or rhinitis. (Patient not taking: Reported on 11/30/2017) 30 tablet 0   Social History   Socioeconomic History  . Marital status: Married    Spouse name: Not on file  . Number of children: Not on file  . Years of education: Not on file  . Highest education level: Not on file  Occupational History  . Not on file  Tobacco Use  . Smoking status: Former Research scientist (life sciences)  . Smokeless tobacco: Never Used  Substance and Sexual Activity  . Alcohol use: No  . Drug use: Not on file  . Sexual activity: Not on file  Other Topics Concern   . Not on file  Social History Narrative  . Not on file   Social Determinants of Health   Financial Resource Strain:   . Difficulty of Paying Living Expenses: Not on file  Food Insecurity:   . Worried About Charity fundraiser in the Last Year: Not on file  . Ran Out of Food in the Last Year: Not on file  Transportation Needs:   . Lack of Transportation (Medical): Not on file  . Lack of Transportation (Non-Medical): Not on file  Physical Activity:   . Days of Exercise per Week: Not on file  . Minutes of Exercise per Session: Not on file  Stress:   . Feeling of Stress : Not on file  Social Connections:   . Frequency of Communication with Friends and Family: Not on file  . Frequency of Social Gatherings with Friends and Family: Not on file  . Attends Religious Services: Not on file  . Active Member of Clubs or Organizations: Not on file  . Attends Archivist Meetings: Not on file  . Marital Status: Not on file  Intimate Partner Violence:   . Fear of Current or Ex-Partner: Not on file  . Emotionally Abused: Not on file  . Physically Abused: Not on file  . Sexually Abused: Not on file   Family History  Family history unknown: Yes    OBJECTIVE:  Vitals:  08/20/19 0929  BP: 124/76  Pulse: 69  Resp: 17  Temp: 98.1 F (36.7 C)  TempSrc: Oral  SpO2: 98%    General appearance: ALERT; in no acute distress.  Head: NCAT Lungs: Normal respiratory effort CV: Radial pulse 2+ Musculoskeletal: RT shoulder  Inspection: Skin warm, dry, clear and intact without obvious erythema, effusion, or ecchymosis.  Palpation: TTP over anterior shoulder over subacromial space ROM: FROM active and passive Strength: 5/5 shld abduction, 5/5 shld adduction, 5/5 elbow flexion, 5/5 elbow extension, 5/5 grip strength Stability: Negative lift off test; negative empty can Skin: warm and dry Neurologic: Ambulates without difficulty; Sensation intact about the upper extremities  Psychological: alert and cooperative; normal mood and affect   ASSESSMENT & PLAN:  1. Acute pain of right shoulder   2. Tendinitis of right rotator cuff     Meds ordered this encounter  Medications  . predniSONE (STERAPRED UNI-PAK 21 TAB) 10 MG (21) TBPK tablet    Sig: Take by mouth daily. Take 6 tabs by mouth daily  for 2 days, then 5 tabs for 2 days, then 4 tabs for 2 days, then 3 tabs for 2 days, 2 tabs for 2 days, then 1 tab by mouth daily for 2 days    Dispense:  42 tablet    Refill:  0    Order Specific Question:   Supervising Provider    Answer:   Eustace Moore [[id number]]  . cyclobenzaprine (FLEXERIL) 10 MG tablet    Sig: Take 1 tablet (10 mg total) by mouth at bedtime.    Dispense:  15 tablet    Refill:  0    Order Specific Question:   Supervising Provider    Answer:   Eustace Moore [[id number]]    Continue conservative management of rest, ice, and gentle stretches Avoid painful activities.  Avoid heavy overhead lifting, use a partner, both upper extremities and trunk with lifting heavy objects Prednisone prescribed.  Take as directed and to completion Take cyclobenzaprine at nighttime for symptomatic relief. Avoid driving or operating heavy machinery while using medication. Follow up with PCP if symptoms persist Return or go to the ER if you have any new or worsening symptoms (fever, chills, chest pain, shortness of breath, worsening symptoms despite treatment, etc...)    Reviewed expectations re: course of current medical issues. Questions answered. Outlined signs and symptoms indicating need for more acute intervention. Patient verbalized understanding. After Visit Summary given.    Rennis Harding, PA-C 08/20/19 1039

## 2019-08-20 NOTE — ED Triage Notes (Signed)
Pt presents with right shoulder pain that developed lst week after lifting metal rods

## 2019-08-27 ENCOUNTER — Other Ambulatory Visit: Payer: Self-pay

## 2019-08-27 ENCOUNTER — Ambulatory Visit
Admission: EM | Admit: 2019-08-27 | Discharge: 2019-08-27 | Disposition: A | Discharge status: Home / Self Care | Payer: Self-pay | Attending: Emergency Medicine | Admitting: Emergency Medicine

## 2019-08-27 DIAGNOSIS — L0291 Cutaneous abscess, unspecified: Secondary | ICD-10-CM

## 2019-08-27 MED ORDER — CEPHALEXIN 250 MG PO CAPS: 250.0000 mg | ORAL_CAPSULE | Freq: Four times a day (QID) | ORAL | 0 refills | Status: DC

## 2019-08-27 NOTE — ED Triage Notes (Addendum)
Pt presents to UC w/ c/o possible abscess between scrotum and rectum x2 days. Pt states it feels like a hard ball and he noticed it draining clear to red fluid yesterday. Pt states it is painful to touch.

## 2019-08-27 NOTE — Discharge Instructions (Addendum)
Apply warm compresses 3-4x daily for 10-15 minutes Wash site daily with warm water and mild soap Keep covered to avoid friction Take antibiotic as prescribed and to completion Follow up here or with PCP if symptoms persists Return or go to the ED if you have any new or worsening symptoms increased redness, swelling, pain, nausea, vomiting, fever, chills, etc...   Reviewed expectations re: course of current medical issues. Questions answered. Outlined signs and symptoms indicating need for more acute intervention. Patient verbalized understanding. After Visit Summary given. 

## 2019-08-27 NOTE — ED Provider Notes (Signed)
[facility]   [id number] 08/27/19 Arrival Time: 0809   [id number]  SUBJECTIVE:  Karl Johnson is a 29 y.o. male who presents with a possible abscess located between the rectum and the scrotum for the past 2 days. Onset gradual, approximately 2 days ago.  Has not used any OTC medication.  Denies any precipitating event.  Denies similar symptoms in the past.  Denies chills, fever, nausea, vomiting, diarrhea, chest pain, chest tightness.  ROS: As per HPI.  All other pertinent ROS negative.     History reviewed. No pertinent past medical history. History reviewed. No pertinent surgical history. No Known Allergies No current facility-administered medications on file prior to encounter.   Current Outpatient Medications on File Prior to Encounter  Medication Sig Dispense Refill  . cyclobenzaprine (FLEXERIL) 10 MG tablet Take 1 tablet (10 mg total) by mouth at bedtime. 15 tablet 0  . diclofenac sodium (VOLTAREN) 1 % GEL Apply 4 g topically 4 (four) times daily as needed (pain). 100 g 0  . famotidine (PEPCID) 20 MG tablet Take 1 tablet (20 mg total) by mouth 2 (two) times daily. 60 tablet 0  . predniSONE (STERAPRED UNI-PAK 21 TAB) 10 MG (21) TBPK tablet Take by mouth daily. Take 6 tabs by mouth daily  for 2 days, then 5 tabs for 2 days, then 4 tabs for 2 days, then 3 tabs for 2 days, 2 tabs for 2 days, then 1 tab by mouth daily for 2 days 42 tablet 0  . [DISCONTINUED] chlorpheniramine (CHLOR-TRIMETON) 4 MG tablet Take 1 tablet (4 mg total) by mouth 2 (two) times daily as needed for allergies or rhinitis. (Patient not taking: Reported on 11/30/2017) 30 tablet 0   Social History   Socioeconomic History  . Marital status: Married    Spouse name: Not on file  . Number of children: Not on file  . Years of education: Not on file  . Highest education level: Not on file  Occupational History  . Not on file  Tobacco Use  . Smoking status: Former Games developer  . Smokeless tobacco: Never Used   Substance and Sexual Activity  . Alcohol use: No  . Drug use: Not on file  . Sexual activity: Not on file  Other Topics Concern  . Not on file  Social History Narrative  . Not on file   Social Determinants of Health   Financial Resource Strain:   . Difficulty of Paying Living Expenses:   Food Insecurity:   . Worried About Programme researcher, broadcasting/film/video in the Last Year:   . Barista in the Last Year:   Transportation Needs:   . Freight forwarder (Medical):   Marland Kitchen Lack of Transportation (Non-Medical):   Physical Activity:   . Days of Exercise per Week:   . Minutes of Exercise per Session:   Stress:   . Feeling of Stress :   Social Connections:   . Frequency of Communication with Friends and Family:   . Frequency of Social Gatherings with Friends and Family:   . Attends Religious Services:   . Active Member of Clubs or Organizations:   . Attends Banker Meetings:   Marland Kitchen Marital Status:   Intimate Partner Violence:   . Fear of Current or Ex-Partner:   . Emotionally Abused:   Marland Kitchen Physically Abused:   . Sexually Abused:    Family History  Problem Relation Age of Onset  . Healthy Mother   . Healthy Father  OBJECTIVE:  Vitals:   08/27/19 0826  BP: 122/85  Pulse: 88  Resp: 16  Temp: 98.2 F (36.8 C)  TempSrc: Oral  SpO2: 96%     General appearance: alert; no distress Chest:  No wheeze,  rales, rhonchi, pleural friction, dyspnea, orthopnea Heart: No murmur, rub or gallop Skin: Abscess present; tender to touch;  active drainage with clear yellowish color Psychological: alert and cooperative; normal mood and affect  Procedure: I&D was not completed.  Conservative measures were suggested.  Patient was in agreement.  ASSESSMENT & PLAN:  1. Cutaneous abscess, unspecified site     Meds ordered this encounter  Medications  . cephALEXin (KEFLEX) 250 MG capsule    Sig: Take 1 capsule (250 mg total) by mouth 4 (four) times daily.    Dispense:  28  capsule    Refill:  0    Abscess without incision and drainage: Apply warm compresses 3-4x daily for 10-15 minutes Wash site daily with warm water and mild soap Keep covered to avoid friction Take antibiotic as prescribed and to completion Follow up here or with PCP if symptoms persists Return or go to the ED if you have any new or worsening symptoms increased redness, swelling, pain, nausea, vomiting, fever, chills, etc...   Reviewed expectations re: course of current medical issues. Questions answered. Outlined signs and symptoms indicating need for more acute intervention. Patient verbalized understanding. After Visit Summary given.          Emerson Monte, [place] 08/27/19 [phone]

## 2019-10-02 ENCOUNTER — Encounter (HOSPITAL_COMMUNITY): Payer: Self-pay | Admitting: Emergency Medicine

## 2019-10-02 ENCOUNTER — Ambulatory Visit
Admission: EM | Admit: 2019-10-02 | Discharge: 2019-10-02 | Disposition: A | Discharge status: Home / Self Care | Payer: Self-pay

## 2019-10-02 ENCOUNTER — Other Ambulatory Visit: Payer: Self-pay

## 2019-10-02 ENCOUNTER — Emergency Department (HOSPITAL_COMMUNITY)
Admission: EM | Admit: 2019-10-02 | Discharge: 2019-10-02 | Disposition: A | Discharge status: Home / Self Care | Payer: Self-pay | Attending: Emergency Medicine | Admitting: Emergency Medicine

## 2019-10-02 DIAGNOSIS — Y9389 Activity, other specified: Secondary | ICD-10-CM | POA: Insufficient documentation

## 2019-10-02 DIAGNOSIS — Y999 Unspecified external cause status: Secondary | ICD-10-CM | POA: Insufficient documentation

## 2019-10-02 DIAGNOSIS — T18128A Food in esophagus causing other injury, initial encounter: Secondary | ICD-10-CM | POA: Insufficient documentation

## 2019-10-02 DIAGNOSIS — Y929 Unspecified place or not applicable: Secondary | ICD-10-CM | POA: Insufficient documentation

## 2019-10-02 DIAGNOSIS — X58XXXA Exposure to other specified factors, initial encounter: Secondary | ICD-10-CM | POA: Insufficient documentation

## 2019-10-02 NOTE — Discharge Instructions (Addendum)
Make sure to cut food into small pieces.  Chew food well before swallowing

## 2019-10-02 NOTE — ED Triage Notes (Signed)
Pt presents with possible food impaction, states after eating steak it feels that it is stuck, instructed pat to be seen in ED for higher level of care .

## 2019-10-02 NOTE — ED Provider Notes (Signed)
[facility] EMERGENCY DEPARTMENT Provider Note   CSN: [id number] Arrival date & time: 10/02/19  1526     History Chief Complaint  Patient presents with  . Food/Drug Challenge    food stuck in throat    Karl Johnson is a 29 y.o. male.  Pt was eating steak and salad and he choked.  Pt feels like steak is stuck.  Pt has tried drinking and he vomits after he drinks   The history is provided by the patient. No language interpreter was used.  Swallowed Foreign Body This is a new problem. The current episode started 1 to 2 hours ago. The problem occurs constantly. The problem has not changed since onset.Pertinent negatives include no chest pain. Nothing aggravates the symptoms. Nothing relieves the symptoms. He has tried nothing for the symptoms. The treatment provided no relief.       History reviewed. No pertinent past medical history.  There are no problems to display for this patient.   History reviewed. No pertinent surgical history.     Family History  Problem Relation Age of Onset  . Healthy Mother   . Healthy Father     Social History   Tobacco Use  . Smoking status: Former Research scientist (life sciences)  . Smokeless tobacco: Never Used  Substance Use Topics  . Alcohol use: No  . Drug use: Not on file    Home Medications Prior to Admission medications   Medication Sig Start Date End Date Taking? Authorizing Provider  cephALEXin (KEFLEX) 250 MG capsule Take 1 capsule (250 mg total) by mouth 4 (four) times daily. 08/27/19   Avegno, Darrelyn Hillock, FNP  cyclobenzaprine (FLEXERIL) 10 MG tablet Take 1 tablet (10 mg total) by mouth at bedtime. 08/20/19   Wurst, [place], PA-C  diclofenac sodium (VOLTAREN) 1 % GEL Apply 4 g topically 4 (four) times daily as needed (pain). 02/04/18   Darel Hong, MD  famotidine (PEPCID) 20 MG tablet Take 1 tablet (20 mg total) by mouth 2 (two) times daily. 12/10/17   Carrie Mew, MD  predniSONE (STERAPRED UNI-PAK 21 TAB) 10 MG (21) TBPK tablet Take by  mouth daily. Take 6 tabs by mouth daily  for 2 days, then 5 tabs for 2 days, then 4 tabs for 2 days, then 3 tabs for 2 days, 2 tabs for 2 days, then 1 tab by mouth daily for 2 days 08/20/19   Stacey Drain, [place], PA-C  chlorpheniramine (CHLOR-TRIMETON) 4 MG tablet Take 1 tablet (4 mg total) by mouth 2 (two) times daily as needed for allergies or rhinitis. Patient not taking: Reported on 11/30/2017 06/19/15 08/20/19  Arlyss Repress, PA-C    Allergies    Patient has no known allergies.  Review of Systems   Review of Systems  Cardiovascular: Negative for chest pain.  All other systems reviewed and are negative.   Physical Exam Updated Vital Signs BP (!) 156/97 (BP Location: Right Arm)   Pulse 87   Temp 98.4 F (36.9 C) (Temporal)   Resp 18   Ht 6\' 1"  (1.854 m)   Wt 95.3 kg   SpO2 100%   BMI 27.71 kg/m   Physical Exam Vitals and nursing note reviewed.  Constitutional:      Appearance: He is well-developed.  HENT:     Head: Normocephalic and atraumatic.  Eyes:     Conjunctiva/sclera: Conjunctivae normal.  Cardiovascular:     Rate and Rhythm: Normal rate and regular rhythm.     Heart sounds: No murmur.  Pulmonary:  Effort: Pulmonary effort is normal. No respiratory distress.     Breath sounds: Normal breath sounds.  Abdominal:     Palpations: Abdomen is soft.     Tenderness: There is no abdominal tenderness.  Musculoskeletal:     Cervical back: Normal range of motion and neck supple.  Skin:    General: Skin is warm and dry.  Neurological:     General: No focal deficit present.     Mental Status: He is alert.  Psychiatric:        Mood and Affect: Mood normal.     ED Results / Procedures / Treatments   Labs (all labs ordered are listed, but only abnormal results are displayed) Labs Reviewed - No data to display  EKG None  Radiology No results found.  Procedures Procedures (including critical care time)  Medications Ordered in ED Medications - No data to  display  ED Course  I have reviewed the triage vital signs and the nursing notes.  Pertinent labs & imaging results that were available during my care of the patient were reviewed by me and considered in my medical decision making (see chart for details).    MDM Rules/Calculators/A&P                      MDM:  Pt given small sips of soda and look up while swallowing.  Pt reports feeling meat go down.  Pt able to finish soda.  Pt advised to follow up with GI  Final Clinical Impression(s) / ED Diagnoses Final diagnoses:  Food impaction of esophagus, initial encounter    Rx / DC Orders ED Discharge Orders    None    An After Visit Summary was printed and given to the patient.    Elson Areas, [place] 10/02/19 1617    Terald Sleeper, MD 10/02/19 1650

## 2019-10-02 NOTE — ED Triage Notes (Signed)
Eating steak and salad and felt that food didn't go down when swallowed   Feel s like it "has moved down" but now with hiccups and feels that he is not swallowing his saliva

## 2019-10-02 NOTE — ED Triage Notes (Signed)
Pt hiccuped and feels food has passed

## 2019-10-02 NOTE — ED Triage Notes (Signed)
Pt feels food has passed  Given carbonated beverage to sip  Feels fine

## 2019-12-05 ENCOUNTER — Other Ambulatory Visit: Payer: Self-pay

## 2019-12-05 ENCOUNTER — Ambulatory Visit
Admission: EM | Admit: 2019-12-05 | Discharge: 2019-12-05 | Disposition: A | Discharge status: Home / Self Care | Payer: BC Managed Care – PPO

## 2019-12-05 ENCOUNTER — Encounter: Payer: Self-pay | Admitting: Emergency Medicine

## 2019-12-05 DIAGNOSIS — L03116 Cellulitis of left lower limb: Secondary | ICD-10-CM

## 2019-12-05 DIAGNOSIS — L02416 Cutaneous abscess of left lower limb: Secondary | ICD-10-CM

## 2019-12-05 MED ORDER — DOXYCYCLINE HYCLATE 100 MG PO CAPS: 100.0000 mg | ORAL_CAPSULE | Freq: Two times a day (BID) | ORAL | 0 refills | Status: DC

## 2019-12-05 NOTE — ED Triage Notes (Signed)
Abscess to LT thigh that started Saturday.  Pt states it has drained but it is very sore, red and painful when he puts weight on his leg.

## 2019-12-05 NOTE — ED Provider Notes (Signed)
[facility]   [id number] 12/05/19 Arrival Time: 0817   [id number]  SUBJECTIVE:  Karl Johnson is a 29 y.o. male who presents with a possible abscess of his LT lower inner thigh x 2 days. Denies precipitating event.  Tried at home remedies with relief, was able to get abscess to pop.  However, still appears red, swollen, and tender to the touch.   Denies fever, chills, nausea, vomiting.    ROS: As per HPI.  All other pertinent ROS negative.     History reviewed. No pertinent past medical history. History reviewed. No pertinent surgical history. No Known Allergies No current facility-administered medications on file prior to encounter.   Current Outpatient Medications on File Prior to Encounter  Medication Sig Dispense Refill  . fluticasone (FLONASE) 50 MCG/ACT nasal spray Place into both nostrils daily.    Marland Kitchen loratadine (CLARITIN) 10 MG tablet Take 10 mg by mouth daily.    . [DISCONTINUED] chlorpheniramine (CHLOR-TRIMETON) 4 MG tablet Take 1 tablet (4 mg total) by mouth 2 (two) times daily as needed for allergies or rhinitis. (Patient not taking: Reported on 11/30/2017) 30 tablet 0  . [DISCONTINUED] famotidine (PEPCID) 20 MG tablet Take 1 tablet (20 mg total) by mouth 2 (two) times daily. 60 tablet 0   Social History   Socioeconomic History  . Marital status: Married    Spouse name: Not on file  . Number of children: Not on file  . Years of education: Not on file  . Highest education level: Not on file  Occupational History  . Not on file  Tobacco Use  . Smoking status: Former Games developer  . Smokeless tobacco: Never Used  Substance and Sexual Activity  . Alcohol use: No  . Drug use: Never  . Sexual activity: Not on file  Other Topics Concern  . Not on file  Social History Narrative  . Not on file   Social Determinants of Health   Financial Resource Strain:   . Difficulty of Paying Living Expenses:   Food Insecurity:   . Worried About Programme researcher, broadcasting/film/video in the  Last Year:   . Barista in the Last Year:   Transportation Needs:   . Freight forwarder (Medical):   Marland Kitchen Lack of Transportation (Non-Medical):   Physical Activity:   . Days of Exercise per Week:   . Minutes of Exercise per Session:   Stress:   . Feeling of Stress :   Social Connections:   . Frequency of Communication with Friends and Family:   . Frequency of Social Gatherings with Friends and Family:   . Attends Religious Services:   . Active Member of Clubs or Organizations:   . Attends Banker Meetings:   Marland Kitchen Marital Status:   Intimate Partner Violence:   . Fear of Current or Ex-Partner:   . Emotionally Abused:   Marland Kitchen Physically Abused:   . Sexually Abused:    Family History  Problem Relation Age of Onset  . Healthy Mother   . Healthy Father     OBJECTIVE:  Vitals:   12/05/19 0829 12/05/19 0830  BP: 125/83   Pulse: 86   Resp: 17   Temp: 98.2 F (36.8 C)   TempSrc: Oral   SpO2: 97%   Weight:  210 lb (95.3 kg)  Height:  6\' 1"  (1.854 m)     General appearance: alert; no distress Skin: apx 2-3 cm of erythema to LT distal medial thigh, TTP, no obvious  drainage, bleeding or pustule Psychological: alert and cooperative; normal mood and affect   ASSESSMENT & PLAN:  1. Abscess of left thigh   2. Cellulitis of left thigh     Meds ordered this encounter  Medications  . doxycycline (VIBRAMYCIN) 100 MG capsule    Sig: Take 1 capsule (100 mg total) by mouth 2 (two) times daily.    Dispense:  20 capsule    Refill:  0    Order Specific Question:   Supervising Provider    Answer:   Raylene Everts [[id number]]   Apply warm compresses 3-4x daily for 10-15 minutes Wash site daily with warm water and mild soap Keep covered to avoid friction Take antibiotic as prescribed and to completion Follow up here or with PCP if symptoms persists Return or go to the ED if you have any new or worsening symptoms increased redness, swelling, pain, nausea,  vomiting, fever, chills, etc...    Reviewed expectations re: course of current medical issues. Questions answered. Outlined signs and symptoms indicating need for more acute intervention. Patient verbalized understanding. After Visit Summary given.          Stacey Drain [place], PA-C 12/05/19 [phone]

## 2019-12-05 NOTE — Discharge Instructions (Signed)
Apply warm compresses 3-4x daily for 10-15 minutes Wash site daily with warm water and mild soap Keep covered to avoid friction Take antibiotic as prescribed and to completion Follow up here or with PCP if symptoms persists Return or go to the ED if you have any new or worsening symptoms increased redness, swelling, pain, nausea, vomiting, fever, chills, etc...  

## 2020-03-01 ENCOUNTER — Ambulatory Visit (INDEPENDENT_AMBULATORY_CARE_PROVIDER_SITE_OTHER): Payer: BC Managed Care – PPO | Admitting: Internal Medicine

## 2020-03-01 ENCOUNTER — Other Ambulatory Visit: Payer: Self-pay

## 2020-03-01 ENCOUNTER — Encounter: Payer: Self-pay | Admitting: Internal Medicine

## 2020-03-01 VITALS — BP 127/80 | HR 78 | Ht 74.0 in | Wt 238.4 lb

## 2020-03-01 DIAGNOSIS — J301 Allergic rhinitis due to pollen: Secondary | ICD-10-CM

## 2020-03-01 DIAGNOSIS — E8881 Metabolic syndrome: Secondary | ICD-10-CM

## 2020-03-01 DIAGNOSIS — J4520 Mild intermittent asthma, uncomplicated: Secondary | ICD-10-CM | POA: Diagnosis not present

## 2020-03-01 DIAGNOSIS — N529 Male erectile dysfunction, unspecified: Secondary | ICD-10-CM | POA: Diagnosis not present

## 2020-03-01 DIAGNOSIS — Z0000 Encounter for general adult medical examination without abnormal findings: Secondary | ICD-10-CM | POA: Diagnosis not present

## 2020-03-01 HISTORY — DX: Mild intermittent asthma, uncomplicated: J45.20

## 2020-03-01 MED ORDER — TADALAFIL 20 MG PO TABS: 10.0000 mg | ORAL_TABLET | ORAL | 11 refills | Status: DC | PRN

## 2020-03-01 MED ORDER — FLUTICASONE PROPIONATE 50 MCG/ACT NA SUSP: 2.0000 | Freq: Every day | NASAL | 6 refills | Status: DC

## 2020-03-01 MED ORDER — BUDESONIDE-FORMOTEROL FUMARATE 160-4.5 MCG/ACT IN AERO: 2.0000 | INHALATION_SPRAY | Freq: Two times a day (BID) | RESPIRATORY_TRACT | 5 refills | Status: DC

## 2020-03-01 NOTE — Assessment & Plan Note (Signed)
Pt gained 28 lbs in three months. He was advised to lose at least 30 lbs to bring his body to the lideal body weight. We'll do a complete blood test, which include testosterone level. His physical exam is normal. No wheezing was heard on the lungs. He says his restless leg syndrone has resolved.

## 2020-03-01 NOTE — Patient Instructions (Signed)
For the information you entered:  Height: 6 feet, 2 inches  Weight: 238 pounds  Your BMI is 30.6, indicating your weight is in the Obese category for adults of your height.  For your height, a healthy weight range would be from 144 to 194 pounds.  People who are overweight or obese are at higher risk for chronic conditions such as high blood pressure, diabetes, and high cholesterol.  Anyone who is overweight should try to avoid gaining additional weight. Additionally, if you are overweight with other risk factors (such as high LDL cholesterol, low HDL cholesterol, or high blood pressure), you should try to lose weight. Even modest weight loss may help lower the risk of disease. Talk with your healthcare provider to determine appropriate ways to lose weight.  For information about the importance of a healthy diet and physical activity in reaching a healthy weight, visit Healthy Weight.   --------------------------------------------------------------------------------  Calorie Counting for Weight Loss  Calories are units of energy. Your body needs a certain amount of calories from food to keep you going throughout the day. When you eat more calories than your body needs, your body stores the extra calories as fat. When you eat fewer calories than your body needs, your body burns fat to get the energy it needs.  Calorie counting means keeping track of how many calories you eat and drink each day. Calorie counting can be helpful if you need to lose weight. If you make sure to eat fewer calories than your body needs, you should lose weight. Ask your health care provider what a healthy weight is for you.  For calorie counting to work, you will need to eat the right number of calories in a day in order to lose a healthy amount of weight per week. A dietitian can help you determine how many calories you need in a day and will give you suggestions on how to reach your calorie goal.  A healthy amount  of weight to lose per week is usually 1-2 lb (0.5-0.9 kg). This usually means that your daily calorie intake should be reduced by 500-750 calories.  Eating 1,200 - 1,500 calories per day can help most women lose weight.  Eating 1,500 - 1,800 calories per day can help most men lose weight.  What is my plan? My goal is to have __________ calories per day. If I have this many calories per day, I should lose around __________ pounds per week.  What do I need to know about calorie counting? In order to meet your daily calorie goal, you will need to:  Find out how many calories are in each food you would like to eat. Try to do this before you eat.  Decide how much of the food you plan to eat.  Write down what you ate and how many calories it had. Doing this is called keeping a food log. To successfully lose weight, it is important to balance calorie counting with a healthy lifestyle that includes regular activity. Aim for 150 minutes of moderate exercise (such as walking) or 75 minutes of vigorous exercise (such as running) each week.  Where do I find calorie information?  The number of calories in a food can be found on a Nutrition Facts label. If a food does not have a Nutrition Facts label, try to look up the calories online or ask your dietitian for help. Remember that calories are listed per serving. If you choose to have more than one serving of  a food, you will have to multiply the calories per serving by the amount of servings you plan to eat. For example, the label on a package of bread might say that a serving size is 1 slice and that there are 90 calories in a serving. If you eat 1 slice, you will have eaten 90 calories. If you eat 2 slices, you will have eaten 180 calories.  How do I keep a food log? Immediately after each meal, record the following information in your food log:  What you ate. Don't forget to include toppings, sauces, and other extras on the food.  How much you  ate. This can be measured in cups, ounces, or number of items.  How many calories each food and drink had.  The total number of calories in the meal. Keep your food log near you, such as in a small notebook in your pocket, or use a mobile app or website. Some programs will calculate calories for you and show you how many calories you have left for the day to meet your goal.  What are some calorie counting tips?  1. Use your calories on foods and drinks that will fill you up and not leave you hungry: ? Some examples of foods that fill you up are nuts and nut butters, vegetables, lean proteins, and high-fiber foods like whole grains. High-fiber foods are foods with more than 5 g fiber per serving. ? Drinks such as sodas, specialty coffee drinks, alcohol, and juices have a lot of calories, yet do not fill you up. 2. Eat nutritious foods and avoid empty calories. Empty calories are calories you get from foods or beverages that do not have many vitamins or protein, such as candy, sweets, and soda. It is better to have a nutritious high-calorie food (such as an avocado) than a food with few nutrients (such as a bag of chips). 3. Know how many calories are in the foods you eat most often. This will help you calculate calorie counts faster. 4. Pay attention to calories in drinks. Low-calorie drinks include water and unsweetened drinks. 5. Pay attention to nutrition labels for "low fat" or "fat free" foods. These foods sometimes have the same amount of calories or more calories than the full fat versions. They also often have added sugar, starch, or salt, to make up for flavor that was removed with the fat. 6. Find a way of tracking calories that works for you. Get creative. Try different apps or programs if writing down calories does not work for you.  What are some portion control tips?  Know how many calories are in a serving. This will help you know how many servings of a certain food you can  have.  Use a measuring cup to measure serving sizes. You could also try weighing out portions on a kitchen scale. With time, you will be able to estimate serving sizes for some foods.  Take some time to put servings of different foods on your favorite plates, bowls, and cups so you know what a serving looks like.  Try not to eat straight from a bag or box. Doing this can lead to overeating. Put the amount you would like to eat in a cup or on a plate to make sure you are eating the right portion.  Use smaller plates, glasses, and bowls to prevent overeating.  Try not to multitask (for example, watch TV or use your computer) while eating. If it is time to eat, sit  down at a table and enjoy your food. This will help you to know when you are full. It will also help you to be aware of what you are eating and how much you are eating.  What are tips for following this plan? Reading food labels  Check the calorie count compared to the serving size. The serving size may be smaller than what you are used to eating.  Check the source of the calories. Make sure the food you are eating is high in vitamins and protein and low in saturated and trans fats.  Shopping  Read nutrition labels while you shop. This will help you make healthy decisions before you decide to purchase your food.  Make a grocery list and stick to it.  Cooking  Try to cook your favorite foods in a healthier way. For example, try baking instead of frying.  Use low-fat dairy products.  Meal planning  Use more fruits and vegetables. Half of your plate should be fruits and vegetables.  Include lean proteins like poultry and fish.  How do I count calories when eating out? 1. Ask for smaller portion sizes. 2. Consider sharing an entree and sides instead of getting your own entree. 3. If you get your own entree, eat only half. Ask for a box at the beginning of your meal and put the rest of your entree in it so you are not  tempted to eat it. 4. If calories are listed on the menu, choose the lower calorie options. 5. Choose dishes that include vegetables, fruits, whole grains, low-fat dairy products, and lean protein. 6. Choose items that are boiled, broiled, grilled, or steamed. Stay away from items that are buttered, battered, fried, or served with cream sauce. Items labeled "crispy" are usually fried, unless stated otherwise. 7. Choose water, low-fat milk, unsweetened iced tea, or other drinks without added sugar. If you want an alcoholic beverage, choose a lower calorie option such as a glass of wine or light beer. 8. Ask for dressings, sauces, and syrups on the side. These are usually high in calories, so you should limit the amount you eat. 9. If you want a salad, choose a garden salad and ask for grilled meats. Avoid extra toppings like bacon, cheese, or fried items. Ask for the dressing on the side, or ask for olive oil and vinegar or lemon to use as dressing. 10. Estimate how many servings of a food you are given. For example, a serving of cooked rice is  cup or about the size of half a baseball. Knowing serving sizes will help you be aware of how much food you are eating at restaurants. The list below tells you how big or small some common portion sizes are based on everyday objects: ? 1 oz--4 stacked dice. ? 3 oz--1 deck of cards. ? 1 tsp--1 die. ? 1 Tbsp-- a ping-pong ball. ? 2 Tbsp--1 ping-pong ball. ?  cup-- baseball. ? 1 cup--1 baseball.  Summary  Calorie counting means keeping track of how many calories you eat and drink each day. If you eat fewer calories than your body needs, you should lose weight.  A healthy amount of weight to lose per week is usually 1-2 lb (0.5-0.9 kg). This usually means reducing your daily calorie intake by 500-750 calories.  The number of calories in a food can be found on a Nutrition Facts label. If a food does not have a Nutrition Facts label, try to look up the  calories online  or ask your dietitian for help.  Use your calories on foods and drinks that will fill you up, and not on foods and drinks that will leave you hungry.  Use smaller plates, glasses, and bowls to prevent overeating.  This information is not intended to replace advice given to you by your health care provider. Make sure you discuss any questions you have with your health care provider. Document Revised: 02/19/2018 Document Reviewed: 05/02/2016 Elsevier Patient Education  2020 [employer].   --------------------------------------------------------------------------------------   Exercising to [employer]  Exercise is structured, repetitive physical activity to improve fitness and health. Getting regular exercise is important for everyone. It is especially important if you are overweight. Being overweight increases your risk of heart disease, stroke, diabetes, high blood pressure, and several types of cancer. Reducing your calorie intake and exercising can help you lose weight.  Exercise is usually categorized as moderate or vigorous intensity. To lose weight, most people need to do a certain amount of moderate-intensity or vigorous-intensity exercise each week.  Moderate-intensity exercise  Moderate-intensity exercise is any activity that gets you moving enough to burn at least three times more energy (calories) than if you were sitting. Examples of moderate exercise include:  Walking a mile in 15 minutes.  Doing light yard work.  Biking at an easy pace. Most people should get at least 150 minutes (2 hours and 30 minutes) a week of moderate-intensity exercise to maintain their body weight.  Vigorous-intensity exercise Vigorous-intensity exercise is any activity that gets you moving enough to burn at least six times more calories than if you were sitting. When you exercise at this intensity, you should be working hard enough that you are not able to carry on a  conversation. Examples of vigorous exercise include:  Running.  Playing a team sport, such as football, basketball, and soccer.  Jumping rope. Most people should get at least 75 minutes (1 hour and 15 minutes) a week of vigorous-intensity exercise to maintain their body weight.  How can exercise affect me? When you exercise enough to burn more calories than you eat, you lose weight. Exercise also reduces body fat and builds muscle. The more muscle you have, the more calories you burn. Exercise also:  Improves mood.  Reduces stress and tension.  Improves your overall fitness, flexibility, and endurance.  Increases bone strength. The amount of exercise you need to lose weight depends on:  Your age.  The type of exercise.  Any health conditions you have.  Your overall physical ability. Talk to your health care provider about how much exercise you need and what types of activities are safe for you.  What actions can I take to lose weight? Nutrition  7. Make changes to your diet as told by your health care provider or diet and nutrition specialist (dietitian). This may include: ? Eating fewer calories. ? Eating more protein. ? Eating less unhealthy fats. ? Eating a diet that includes fresh fruits and vegetables, whole grains, low-fat dairy products, and lean protein. ? Avoiding foods with added fat, salt, and sugar. 8. Drink plenty of water while you exercise to prevent dehydration or heat stroke.  Activity 11. Choose an activity that you enjoy and set realistic goals. Your health care provider can help you make an exercise plan that works for you. 12. Exercise at a moderate or vigorous intensity most days of the week. ? The intensity of exercise may vary from person to person. You can tell how intense a workout  is for you by paying attention to your breathing and heartbeat. Most people will notice their breathing and heartbeat get faster with more intense exercise. 13. Do  resistance training twice each week, such as: ? Push-ups. ? Sit-ups. ? Lifting weights. ? Using resistance bands. 14. Getting short amounts of exercise can be just as helpful as long structured periods of exercise. If you have trouble finding time to exercise, try to include exercise in your daily routine. ? Get up, stretch, and walk around every 30 minutes throughout the day. ? Go for a walk during your lunch break. ? Park your car farther away from your destination. ? If you take public transportation, get off one stop early and walk the rest of the way. ? Make phone calls while standing up and walking around. ? Take the stairs instead of elevators or escalators. 15. Wear comfortable clothes and shoes with good support. 16. Do not exercise so much that you hurt yourself, feel dizzy, or get very short of breath.  Where to find more information  [place] Department of Health and Human Services: ThisPath.fiwww.hhs.gov  Centers for Disease Control and Prevention (CDC): FootballExhibition.com.brwww.cdc.gov  Contact a health care provider:  Before starting a new exercise program.  If you have questions or concerns about your weight.  If you have a medical problem that keeps you from exercising.  Get help right away if you have any of the following while exercising:  Injury.  Dizziness.  Difficulty breathing or shortness of breath that does not go away when you stop exercising.  Chest pain.  Rapid heartbeat.  Summary  Being overweight increases your risk of heart disease, stroke, diabetes, high blood pressure, and several types of cancer.  Losing weight happens when you burn more calories than you eat.  Reducing the amount of calories you eat in addition to getting regular moderate or vigorous exercise each week helps you lose weight.  This information is not intended to replace advice given to you by your health care provider. Make sure you discuss any questions you have with your health care  provider. Document Revised: 06/15/2017 Document Reviewed: 06/15/2017 Elsevier Patient Education  2020 [employer]

## 2020-03-01 NOTE — Assessment & Plan Note (Signed)
Pt was started on Cialis 20 mg PO as needed. We will check his testosterone levels.

## 2020-03-01 NOTE — Assessment & Plan Note (Signed)
-   I encouraged the patient to lose weight.  - I educated them on making healthy dietary choices including eating more fruits and vegetables and less fried foods. - I encouraged the patient to exercise more, and educated on the benefits of exercise including weight loss, diabetes prevention, and hypertension prevention.   

## 2020-03-01 NOTE — Assessment & Plan Note (Addendum)
Pt was started on Symbicort 2 puffs BID.

## 2020-03-01 NOTE — Assessment & Plan Note (Signed)
Pt was advised to take flonaise and claritin for allergic rhinitis.

## 2020-03-01 NOTE — Progress Notes (Signed)
Established Patient Office Visit  SUBJECTIVE:  Subjective  Patient ID: Karl Johnson, male    DOB: Nov 24, 1990  Age: 29 y.o. MRN: [id number]  CC:  Chief Complaint  Patient presents with  . Follow-up    no complaints     HPI Karl Johnson is a 29 y.o. male presenting today for a general follow up.  He feels generally healthy overall. He notes that he has had some difficulties with ED, but that it is not too big of an issue for him at this point. He has gained almost 30 lbs in the last three months. He states that he has been eating excessively and not working out too much.   He is vaccinated against COVID19.    History reviewed. No pertinent past medical history.  History reviewed. No pertinent surgical history.  Family History  Problem Relation Age of Onset  . Healthy Mother   . Healthy Father   . Hypertension Maternal Grandmother   . Hypertension Maternal Grandfather     Social History   Socioeconomic History  . Marital status: Married    Spouse name: Not on file  . Number of children: 2  . Years of education: Not on file  . Highest education level: Not on file  Occupational History  . Not on file  Tobacco Use  . Smoking status: Former Games developer  . Smokeless tobacco: Never Used  Substance and Sexual Activity  . Alcohol use: No  . Drug use: Never  . Sexual activity: Not on file  Other Topics Concern  . Not on file  Social History Narrative  . Not on file   Social Determinants of Health   Financial Resource Strain:   . Difficulty of Paying Living Expenses: Not on file  Food Insecurity:   . Worried About Programme researcher, broadcasting/film/video in the Last Year: Not on file  . Ran Out of Food in the Last Year: Not on file  Transportation Needs:   . Lack of Transportation (Medical): Not on file  . Lack of Transportation (Non-Medical): Not on file  Physical Activity:   . Days of Exercise per Week: Not on file  . Minutes of Exercise per Session: Not on file  Stress:   .  Feeling of Stress : Not on file  Social Connections:   . Frequency of Communication with Friends and Family: Not on file  . Frequency of Social Gatherings with Friends and Family: Not on file  . Attends Religious Services: Not on file  . Active Member of Clubs or Organizations: Not on file  . Attends Banker Meetings: Not on file  . Marital Status: Not on file  Intimate Partner Violence:   . Fear of Current or Ex-Partner: Not on file  . Emotionally Abused: Not on file  . Physically Abused: Not on file  . Sexually Abused: Not on file     Current Outpatient Medications:  .  fluticasone (FLONASE) 50 MCG/ACT nasal spray, Place into both nostrils daily., Disp: , Rfl:  .  loratadine (CLARITIN) 10 MG tablet, Take 10 mg by mouth daily., Disp: , Rfl:  .  budesonide-formoterol (SYMBICORT) 160-4.5 MCG/ACT inhaler, Inhale 2 puffs into the lungs 2 (two) times daily., Disp: 1 each, Rfl: 5 .  fluticasone (FLONASE) 50 MCG/ACT nasal spray, Place 2 sprays into both nostrils daily., Disp: 16 g, Rfl: 6 .  tadalafil (CIALIS) 20 MG tablet, Take 0.5-1 tablets (10-20 mg total) by mouth every other day as needed for erectile  dysfunction., Disp: 5 tablet, Rfl: 11   No Known Allergies  ROS Review of Systems  Constitutional: Negative.   HENT: Negative.   Eyes: Negative.   Respiratory: Negative.        Asthma  Cardiovascular: Negative.   Gastrointestinal: Negative.   Endocrine: Negative.   Genitourinary: Negative.   Musculoskeletal: Negative.   Skin: Negative.   Allergic/Immunologic: Positive for environmental allergies.  Neurological: Negative.   Hematological: Negative.   Psychiatric/Behavioral: Negative.   All other systems reviewed and are negative.    OBJECTIVE:    Physical Exam Vitals reviewed.  Constitutional:      Appearance: Normal appearance.  HENT:     Mouth/Throat:     Mouth: Mucous membranes are moist.  Eyes:     Pupils: Pupils are equal, round, and reactive to  light.  Neck:     Vascular: No carotid bruit.  Cardiovascular:     Rate and Rhythm: Normal rate and regular rhythm.     Pulses: Normal pulses.     Heart sounds: Normal heart sounds.  Pulmonary:     Effort: Pulmonary effort is normal.     Breath sounds: Normal breath sounds.  Abdominal:     General: Bowel sounds are normal.     Palpations: Abdomen is soft. There is no hepatomegaly, splenomegaly or mass.     Tenderness: There is no abdominal tenderness.     Hernia: No hernia is present.  Musculoskeletal:     Cervical back: Neck supple.     Right lower leg: No edema.     Left lower leg: No edema.  Skin:    Findings: No rash.  Neurological:     Mental Status: He is alert and oriented to person, place, and time.     Motor: No weakness.  Psychiatric:        Mood and Affect: Mood normal.        Behavior: Behavior normal.     BP 127/80   Pulse 78   Ht 6\' 2"  (1.88 m)   Wt 238 lb 6.4 oz (108.1 kg)   BMI 30.61 kg/m  Wt Readings from Last 3 Encounters:  03/01/20 238 lb 6.4 oz (108.1 kg)  12/05/19 210 lb (95.3 kg)  10/02/19 210 lb (95.3 kg)    Health Maintenance Due  Topic Date Due  . Hepatitis C Screening  Never done  . COVID-19 Vaccine (1) Never done  . HIV Screening  Never done  . TETANUS/TDAP  Never done  . INFLUENZA VACCINE  01/15/2020    There are no preventive care reminders to display for this patient.  CBC Latest Ref Rng & Units 02/04/2018 12/10/2017 11/30/2017  WBC 3.8 - 10.6 K/uL 8.8 11.0(H) 6.8  Hemoglobin 13.0 - 18.0 g/dL 12/02/2017 67.3 41.9  Hematocrit 40 - 52 % 45.9 47.8 48.1  Platelets 150 - 440 K/uL 297 327 325   CMP Latest Ref Rng & Units 02/04/2018 12/10/2017 11/30/2017  Glucose 70 - 99 mg/dL 12/02/2017) 98 024(O)  BUN 6 - 20 mg/dL 13 11 11   Creatinine 0.61 - 1.24 mg/dL 973(Z 3.29  Sodium 135 - 145 mmol/L 139 139 137  Potassium 3.5 - 5.1 mmol/L 4.4 3.7 4.2  Chloride 98 - 111 mmol/L 104 106 106  CO2 22 - 32 mmol/L 28 24 24   Calcium 8.9 - 10.3 mg/dL 9.24)  9.2 9.1  Total Protein 6.5 - 8.1 g/dL 7.3 7.2 7.4  Total Bilirubin 0.3 - 1.2 mg/dL 2.68) 0.4 0.4  Alkaline  Phos 38 - 126 U/L 78 95 97  AST 15 - 41 U/L 25 22 26   ALT 0 - 44 U/L 24 19 23     No results found for: TSH Lab Results  Component Value Date   ALBUMIN 4.5 02/04/2018   ANIONGAP 7 02/04/2018   No results found for: CHOL, HDL, LDLCALC, CHOLHDL No results found for: TRIG No results found for: HGBA1C    ASSESSMENT & PLAN:   Problem List Items Addressed This Visit      Respiratory   Seasonal allergic rhinitis due to pollen - Primary    Pt was advised to take flonaise and claritin for allergic rhinitis.       Relevant Medications   fluticasone (FLONASE) 50 MCG/ACT nasal spray   Mild intermittent asthma without complication    Pt was started on Symbicort 2 puffs BID.       Relevant Medications   budesonide-formoterol (SYMBICORT) 160-4.5 MCG/ACT inhaler     Other   Metabolic syndrome    - I encouraged the patient to lose weight.  - I educated them on making healthy dietary choices including eating more fruits and vegetables and less fried foods. - I encouraged the patient to exercise more, and educated on the benefits of exercise including weight loss, diabetes prevention, and hypertension prevention.        Annual physical exam    Pt gained 28 lbs in three months. He was advised to lose at least 30 lbs to bring his body to the lideal body weight. We'll do a complete blood test, which include testosterone level. His physical exam is normal. No wheezing was heard on the lungs. He says his restless leg syndrone has resolved.       Impotence    Pt was started on Cialis 20 mg PO as needed. We will check his testosterone levels.       Relevant Medications   tadalafil (CIALIS) 20 MG tablet      Meds ordered this encounter  Medications  . budesonide-formoterol (SYMBICORT) 160-4.5 MCG/ACT inhaler    Sig: Inhale 2 puffs into the lungs 2 (two) times daily.     Dispense:  1 each    Refill:  5  . fluticasone (FLONASE) 50 MCG/ACT nasal spray    Sig: Place 2 sprays into both nostrils daily.    Dispense:  16 g    Refill:  6  . tadalafil (CIALIS) 20 MG tablet    Sig: Take 0.5-1 tablets (10-20 mg total) by mouth every other day as needed for erectile dysfunction.    Dispense:  5 tablet    Refill:  11    Follow-up: No follow-ups on file.    02/06/2018, MD [facility] [address] [place], [address] [place]   By signing my name below, I, [place], attest that this documentation has been prepared under the direction and in the presence of Dr. [id number] Electronically Signed: [employer], MD 03/01/20, 5:05 PM  I personally performed the services described in this documentation, which was SCRIBED in my presence. The recorded information has been reviewed and considered accurate. It has been edited as necessary during review. Corky Downs, MD

## 2020-03-02 MED ORDER — BREO ELLIPTA 100-25 MCG/INH IN AEPB: 1.0000 | INHALATION_SPRAY | Freq: Two times a day (BID) | RESPIRATORY_TRACT | 4 refills | Status: DC

## 2020-03-02 NOTE — Addendum Note (Signed)
Addended by: Jobie Quaker on: 03/02/2020 04:10 PM   Modules accepted: Orders

## 2020-03-05 ENCOUNTER — Other Ambulatory Visit: Payer: Self-pay

## 2020-03-05 ENCOUNTER — Ambulatory Visit (INDEPENDENT_AMBULATORY_CARE_PROVIDER_SITE_OTHER): Payer: BC Managed Care – PPO | Admitting: Internal Medicine

## 2020-03-05 DIAGNOSIS — N529 Male erectile dysfunction, unspecified: Secondary | ICD-10-CM

## 2020-03-05 DIAGNOSIS — Z0000 Encounter for general adult medical examination without abnormal findings: Secondary | ICD-10-CM | POA: Diagnosis not present

## 2020-03-05 DIAGNOSIS — E8881 Metabolic syndrome: Secondary | ICD-10-CM | POA: Diagnosis not present

## 2020-03-06 LAB — COMPLETE METABOLIC PANEL WITH GFR
AG Ratio: 1.8 (calc) (ref 1.0–2.5)
ALT: 35 U/L (ref 9–46)
AST: 27 U/L (ref 10–40)
Albumin: 4.4 g/dL (ref 3.6–5.1)
Alkaline phosphatase (APISO): 97 U/L (ref 36–130)
BUN: 13 mg/dL (ref 7–25)
CO2: 24 mmol/L (ref 20–32)
Calcium: 9.6 mg/dL (ref 8.6–10.3)
Chloride: 103 mmol/L (ref 98–110)
Creat: 1 mg/dL (ref 0.60–1.35)
GFR, Est African American: 118 mL/min/{1.73_m2} (ref >=60)
GFR, Est Non African American: 102 mL/min/{1.73_m2} (ref >=60)
Globulin: 2.4 g/dL (calc) (ref 1.9–3.7)
Glucose, Bld: 101 mg/dL — ABNORMAL HIGH (ref 65–99)
Potassium: 4.8 mmol/L (ref 3.5–5.3)
Sodium: 137 mmol/L (ref 135–146)
Total Bilirubin: 0.3 mg/dL (ref 0.2–1.2)
Total Protein: 6.8 g/dL (ref 6.1–8.1)

## 2020-03-06 LAB — LIPID PANEL
Cholesterol: 142 mg/dL (ref <200)
HDL: 34 mg/dL — ABNORMAL LOW (ref >=40)
LDL Cholesterol (Calc): 83 mg/dL (calc)
Non-HDL Cholesterol (Calc): 108 mg/dL (calc) (ref <130)
Total CHOL/HDL Ratio: 4.2 (calc) (ref <5.0)
Triglycerides: 146 mg/dL (ref <150)

## 2020-03-06 LAB — CBC WITH DIFFERENTIAL/PLATELET
Absolute Monocytes: 540 cells/uL (ref 200–950)
Basophils Absolute: 60 cells/uL (ref 0–200)
Basophils Relative: 0.8 %
Eosinophils Absolute: 600 cells/uL — ABNORMAL HIGH (ref 15–500)
Eosinophils Relative: 8 %
HCT: 49.4 % (ref 38.5–50.0)
Hemoglobin: 16.4 g/dL (ref 13.2–17.1)
Lymphs Abs: 1748 cells/uL (ref 850–3900)
MCH: 28.3 pg (ref 27.0–33.0)
MCHC: 33.2 g/dL (ref 32.0–36.0)
MCV: 85.3 fL (ref 80.0–100.0)
MPV: 9.4 fL (ref 7.5–12.5)
Monocytes Relative: 7.2 %
Neutro Abs: 4553 cells/uL (ref 1500–7800)
Neutrophils Relative %: 60.7 %
Platelets: 322 10*3/uL (ref 140–400)
RBC: 5.79 10*6/uL (ref 4.20–5.80)
RDW: 13.1 % (ref 11.0–15.0)
Total Lymphocyte: 23.3 %
WBC: 7.5 10*3/uL (ref 3.8–10.8)

## 2020-03-06 LAB — TESTOSTERONE: Testosterone: 324 ng/dL (ref 250–827)

## 2020-03-06 LAB — TSH: TSH: 1.13 m[IU]/L (ref 0.40–4.50)

## 2020-03-09 ENCOUNTER — Ambulatory Visit (INDEPENDENT_AMBULATORY_CARE_PROVIDER_SITE_OTHER): Payer: BC Managed Care – PPO | Admitting: Internal Medicine

## 2020-03-09 ENCOUNTER — Encounter: Payer: Self-pay | Admitting: Internal Medicine

## 2020-03-09 ENCOUNTER — Other Ambulatory Visit: Payer: Self-pay

## 2020-03-09 VITALS — BP 142/86 | HR 78 | Ht 73.0 in | Wt 237.6 lb

## 2020-03-09 DIAGNOSIS — N529 Male erectile dysfunction, unspecified: Secondary | ICD-10-CM

## 2020-03-09 DIAGNOSIS — E8881 Metabolic syndrome: Secondary | ICD-10-CM

## 2020-03-09 DIAGNOSIS — J4520 Mild intermittent asthma, uncomplicated: Secondary | ICD-10-CM | POA: Diagnosis not present

## 2020-03-09 DIAGNOSIS — J301 Allergic rhinitis due to pollen: Secondary | ICD-10-CM

## 2020-03-09 MED ORDER — FLUTICASONE-SALMETEROL 250-50 MCG/DOSE IN AEPB: 1.0000 | INHALATION_SPRAY | Freq: Two times a day (BID) | RESPIRATORY_TRACT | 3 refills | Status: DC

## 2020-03-09 NOTE — Assessment & Plan Note (Signed)
Patient has been advised to lose weight. 

## 2020-03-09 NOTE — Assessment & Plan Note (Signed)
Testosterone level is normal

## 2020-03-09 NOTE — Progress Notes (Signed)
Patient ID: Karl Johnson, male   DOB: 1990/06/22, 29 y.o.   MRN: [id number]    Established Patient Office Visit  Subjective:  Patient ID: Karl Johnson, male    DOB: February 04, 1991  Age: 51 y.o. MRN: [id number]  CC:  Chief Complaint  Patient presents with  . Lab Results    HPI  Karl Johnson presents to review his lab results. He states that both of the inhalers are about $300 each, which is difficult for him to afford. He has been using Brio samples but is hoping that he can try something more long term.  History reviewed. No pertinent past medical history.  History reviewed. No pertinent surgical history.  Family History  Problem Relation Age of Onset  . Healthy Mother   . Healthy Father   . Hypertension Maternal Grandmother   . Hypertension Maternal Grandfather     Social History   Socioeconomic History  . Marital status: Married    Spouse name: Not on file  . Number of children: 2  . Years of education: Not on file  . Highest education level: Not on file  Occupational History  . Not on file  Tobacco Use  . Smoking status: Former Games developer  . Smokeless tobacco: Never Used  Substance and Sexual Activity  . Alcohol use: No  . Drug use: Never  . Sexual activity: Not on file  Other Topics Concern  . Not on file  Social History Narrative  . Not on file   Social Determinants of Health   Financial Resource Strain:   . Difficulty of Paying Living Expenses: Not on file  Food Insecurity:   . Worried About Programme researcher, broadcasting/film/video in the Last Year: Not on file  . Ran Out of Food in the Last Year: Not on file  Transportation Needs:   . Lack of Transportation (Medical): Not on file  . Lack of Transportation (Non-Medical): Not on file  Physical Activity:   . Days of Exercise per Week: Not on file  . Minutes of Exercise per Session: Not on file  Stress:   . Feeling of Stress : Not on file  Social Connections:   . Frequency of Communication with Friends and Family: Not on  file  . Frequency of Social Gatherings with Friends and Family: Not on file  . Attends Religious Services: Not on file  . Active Member of Clubs or Organizations: Not on file  . Attends Banker Meetings: Not on file  . Marital Status: Not on file  Intimate Partner Violence:   . Fear of Current or Ex-Partner: Not on file  . Emotionally Abused: Not on file  . Physically Abused: Not on file  . Sexually Abused: Not on file     Current Outpatient Medications:  .  fluticasone (FLONASE) 50 MCG/ACT nasal spray, Place 2 sprays into both nostrils daily., Disp: 16 g, Rfl: 6 .  fluticasone furoate-vilanterol (BREO ELLIPTA) 100-25 MCG/INH AEPB, Inhale 1 puff into the lungs in the morning and at bedtime., Disp: 60 each, Rfl: 4 .  Fluticasone-Salmeterol (ADVAIR DISKUS) 250-50 MCG/DOSE AEPB, Inhale 1 puff into the lungs 2 (two) times daily., Disp: 1 each, Rfl: 3 .  loratadine (CLARITIN) 10 MG tablet, Take 10 mg by mouth daily., Disp: , Rfl:  .  tadalafil (CIALIS) 20 MG tablet, Take 0.5-1 tablets (10-20 mg total) by mouth every other day as needed for erectile dysfunction., Disp: 5 tablet, Rfl: 11   No Known Allergies  ROS Review of  Systems  Constitutional: Negative.   HENT: Negative.   Eyes: Negative.   Respiratory: Negative.   Cardiovascular: Negative.   Gastrointestinal: Negative.   Endocrine: Negative.   Genitourinary: Negative.   Musculoskeletal: Negative.   Skin: Negative.   Allergic/Immunologic: Negative.   Neurological: Negative.   Hematological: Negative.   Psychiatric/Behavioral: Negative.   All other systems reviewed and are negative.     Objective:    Physical Exam Vitals reviewed.  Constitutional:      Appearance: Normal appearance.  HENT:     Mouth/Throat:     Mouth: Mucous membranes are moist.  Eyes:     Pupils: Pupils are equal, round, and reactive to light.  Neck:     Vascular: No carotid bruit.  Cardiovascular:     Rate and Rhythm: Normal rate  and regular rhythm.     Pulses: Normal pulses.     Heart sounds: Normal heart sounds.  Pulmonary:     Effort: Pulmonary effort is normal.     Breath sounds: Normal breath sounds.  Abdominal:     General: Bowel sounds are normal.     Palpations: Abdomen is soft. There is no hepatomegaly, splenomegaly or mass.     Tenderness: There is no abdominal tenderness.     Hernia: No hernia is present.  Musculoskeletal:     Cervical back: Neck supple.     Right lower leg: No edema.     Left lower leg: No edema.  Skin:    Findings: No rash.  Neurological:     Mental Status: He is alert and oriented to person, place, and time.     Motor: No weakness.  Psychiatric:        Mood and Affect: Mood normal.        Behavior: Behavior normal.     BP (!) 142/86   Pulse 78   Ht 6\' 1"  (1.854 m)   Wt 237 lb 9.6 oz (107.8 kg)   BMI 31.35 kg/m  Wt Readings from Last 3 Encounters:  03/09/20 237 lb 9.6 oz (107.8 kg)  03/01/20 238 lb 6.4 oz (108.1 kg)  12/05/19 210 lb (95.3 kg)     Health Maintenance Due  Topic Date Due  . Hepatitis C Screening  Never done  . COVID-19 Vaccine (1) Never done  . HIV Screening  Never done  . TETANUS/TDAP  Never done  . INFLUENZA VACCINE  01/15/2020    There are no preventive care reminders to display for this patient.  Lab Results  Component Value Date   TSH 1.13 03/05/2020   Lab Results  Component Value Date   WBC 7.5 03/05/2020   HGB 16.4 03/05/2020   HCT 49.4 03/05/2020   MCV 85.3 03/05/2020   PLT 322 03/05/2020   Lab Results  Component Value Date   NA 137 03/05/2020   K 4.8 03/05/2020   CO2 24 03/05/2020   GLUCOSE 101 (H) 03/05/2020   BUN 13 03/05/2020   CREATININE 1.00 03/05/2020   BILITOT 0.3 03/05/2020   ALKPHOS 78 02/04/2018   AST 27 03/05/2020   ALT 35 03/05/2020   PROT 6.8 03/05/2020   ALBUMIN 4.5 02/04/2018   CALCIUM 9.6 03/05/2020   ANIONGAP 7 02/04/2018   Lab Results  Component Value Date   CHOL 142 03/05/2020   Lab  Results  Component Value Date   HDL 34 (L) 03/05/2020   Lab Results  Component Value Date   LDLCALC 83 03/05/2020   Lab Results  Component  Value Date   TRIG 146 03/05/2020   Lab Results  Component Value Date   CHOLHDL 4.2 03/05/2020   No results found for: HGBA1C    Assessment & Plan:   Problem List Items Addressed This Visit      Respiratory   Seasonal allergic rhinitis due to pollen    Patient cannot afford Breo so  I giave a prescription for Advair inhaler 250/50 twice a day.      Mild intermittent asthma without complication    Patient was not found to be wheezing today chest no rales.  He does not smoke does not drink      Relevant Medications   Fluticasone-Salmeterol (ADVAIR DISKUS) 250-50 MCG/DOSE AEPB     Other   Metabolic syndrome - Primary    Patient has been advised to lose weight.      Impotence    Testosterone level is normal.         Meds ordered this encounter  Medications  . Fluticasone-Salmeterol (ADVAIR DISKUS) 250-50 MCG/DOSE AEPB    Sig: Inhale 1 puff into the lungs 2 (two) times daily.    Dispense:  1 each    Refill:  3    Follow-up: Return in about 3 months (around 06/08/2020).    By signing my name below, I, Pietro Cassis, attest that this documentation has been prepared under the direction and in the presence of Corky Downs, MD. Electronically Signed: Pietro Cassis, Medical Scribe. 03/09/20. 4:13 PM.   I personally performed the services described in this documentation, which was SCRIBED in my presence. The recorded information has been reviewed and considered accurate. It has been edited as necessary during review. Corky Downs, MD

## 2020-03-09 NOTE — Assessment & Plan Note (Addendum)
Patient cannot afford Breo so I gave prescription for Advair inhaler 250/50 twice a day.

## 2020-03-09 NOTE — Assessment & Plan Note (Signed)
Patient was not found to be wheezing today chest no rales.  He does not smoke does not drink

## 2020-06-25 ENCOUNTER — Encounter: Payer: Self-pay | Admitting: Internal Medicine

## 2020-06-25 ENCOUNTER — Other Ambulatory Visit: Payer: Self-pay

## 2020-06-25 ENCOUNTER — Ambulatory Visit (INDEPENDENT_AMBULATORY_CARE_PROVIDER_SITE_OTHER): Payer: BC Managed Care – PPO | Admitting: Internal Medicine

## 2020-06-25 VITALS — BP 124/75 | HR 78 | Ht 73.0 in | Wt 235.4 lb

## 2020-06-25 DIAGNOSIS — E8881 Metabolic syndrome: Secondary | ICD-10-CM

## 2020-06-25 DIAGNOSIS — J301 Allergic rhinitis due to pollen: Secondary | ICD-10-CM

## 2020-06-25 DIAGNOSIS — N529 Male erectile dysfunction, unspecified: Secondary | ICD-10-CM | POA: Diagnosis not present

## 2020-06-25 DIAGNOSIS — J4520 Mild intermittent asthma, uncomplicated: Secondary | ICD-10-CM | POA: Diagnosis not present

## 2020-06-25 NOTE — Progress Notes (Signed)
Established Patient Office Visit  Subjective:  Patient ID: Karl Johnson, male    DOB: [birth year]  Age: 30 y.o. MRN: [id number]  CC:  Chief Complaint  Patient presents with  . Asthma    HPI  Karl Johnson presents for asthma History reviewed. No pertinent past medical history.  History reviewed. No pertinent surgical history.  Family History  Problem Relation Age of Onset  . Healthy Mother   . Healthy Father   . Hypertension Maternal Grandmother   . Hypertension Maternal Grandfather     Social History   Socioeconomic History  . Marital status: Married    Spouse name: Not on file  . Number of children: 2  . Years of education: Not on file  . Highest education level: Not on file  Occupational History  . Not on file  Tobacco Use  . Smoking status: Former Games developer  . Smokeless tobacco: Never Used  Substance and Sexual Activity  . Alcohol use: No  . Drug use: Never  . Sexual activity: Not on file  Other Topics Concern  . Not on file  Social History Narrative  . Not on file   Social Determinants of Health   Financial Resource Strain: Not on file  Food Insecurity: Not on file  Transportation Needs: Not on file  Physical Activity: Not on file  Stress: Not on file  Social Connections: Not on file  Intimate Partner Violence: Not on file     Current Outpatient Medications:  .  fluticasone (FLONASE) 50 MCG/ACT nasal spray, Place 2 sprays into both nostrils daily., Disp: 16 g, Rfl: 6 .  fluticasone furoate-vilanterol (BREO ELLIPTA) 100-25 MCG/INH AEPB, Inhale 1 puff into the lungs in the morning and at bedtime., Disp: 60 each, Rfl: 4 .  Fluticasone-Salmeterol (ADVAIR DISKUS) 250-50 MCG/DOSE AEPB, Inhale 1 puff into the lungs 2 (two) times daily., Disp: 1 each, Rfl: 3 .  loratadine (CLARITIN) 10 MG tablet, Take 10 mg by mouth daily., Disp: , Rfl:  .  tadalafil (CIALIS) 20 MG tablet, Take 0.5-1 tablets (10-20 mg total) by mouth every other day as needed for erectile  dysfunction., Disp: 5 tablet, Rfl: 11   No Known Allergies  ROS Review of Systems  Constitutional: Negative.   HENT: Negative.   Eyes: Negative.   Respiratory: Negative.   Cardiovascular: Negative.   Gastrointestinal: Negative.   Endocrine: Negative.   Genitourinary: Negative.   Musculoskeletal: Negative.   Skin: Negative.   Allergic/Immunologic: Negative.   Neurological: Negative.   Hematological: Negative.   Psychiatric/Behavioral: Negative.   All other systems reviewed and are negative.     Objective:    Physical Exam Vitals reviewed.  Constitutional:      Appearance: Normal appearance.  HENT:     Mouth/Throat:     Mouth: Mucous membranes are moist.  Eyes:     Pupils: Pupils are equal, round, and reactive to light.  Neck:     Vascular: No carotid bruit.  Cardiovascular:     Rate and Rhythm: Normal rate and regular rhythm.     Pulses: Normal pulses.     Heart sounds: Normal heart sounds.  Pulmonary:     Effort: Pulmonary effort is normal.     Breath sounds: Normal breath sounds.  Abdominal:     General: Bowel sounds are normal.     Palpations: Abdomen is soft. There is no hepatomegaly, splenomegaly or mass.     Tenderness: There is no abdominal tenderness.     Hernia: No  hernia is present.  Musculoskeletal:     Cervical back: Neck supple.     Right lower leg: No edema.     Left lower leg: No edema.  Skin:    Findings: No rash.  Neurological:     Mental Status: He is alert and oriented to person, place, and time.     Motor: No weakness.  Psychiatric:        Mood and Affect: Mood normal.        Behavior: Behavior normal.     There were no vitals taken for this visit. Wt Readings from Last 3 Encounters:  03/09/20 237 lb 9.6 oz (107.8 kg)  03/01/20 238 lb 6.4 oz (108.1 kg)  12/05/19 210 lb (95.3 kg)     Health Maintenance Due  Topic Date Due  . Hepatitis C Screening  Never done  . COVID-19 Vaccine (1) Never done  . HIV Screening  Never done   . TETANUS/TDAP  Never done    There are no preventive care reminders to display for this patient.  Lab Results  Component Value Date   TSH 1.13 03/05/2020   Lab Results  Component Value Date   WBC 7.5 03/05/2020   HGB 16.4 03/05/2020   HCT 49.4 03/05/2020   MCV 85.3 03/05/2020   PLT 322 03/05/2020   Lab Results  Component Value Date   NA 137 03/05/2020   K 4.8 03/05/2020   CO2 24 03/05/2020   GLUCOSE 101 (H) 03/05/2020   BUN 13 03/05/2020   CREATININE 1.00 03/05/2020   BILITOT 0.3 03/05/2020   ALKPHOS 78 02/04/2018   AST 27 03/05/2020   ALT 35 03/05/2020   PROT 6.8 03/05/2020   ALBUMIN 4.5 02/04/2018   CALCIUM 9.6 03/05/2020   ANIONGAP 7 02/04/2018   Lab Results  Component Value Date   CHOL 142 03/05/2020   Lab Results  Component Value Date   HDL 34 (L) 03/05/2020   Lab Results  Component Value Date   LDLCALC 83 03/05/2020   Lab Results  Component Value Date   TRIG 146 03/05/2020   Lab Results  Component Value Date   CHOLHDL 4.2 03/05/2020   No results found for: HGBA1C    Assessment & Plan:   Problem List Items Addressed This Visit      Respiratory   Seasonal allergic rhinitis due to pollen    Patient was advised to continue taking Claritin 5 mg p.o. daily allergic rhinitis.      Mild intermittent asthma without complication - Primary    Asthma is under control on inhalers.  Patient does not smoke.  He denies any history of any sinus problem postnasal drip.        Other   Metabolic syndrome    - I encouraged the patient to lose weight.  - I educated them on making healthy dietary choices including eating more fruits and vegetables and less fried foods. - I encouraged the patient to exercise more, and educated on the benefits of exercise including weight loss, diabetes prevention, and hypertension prevention.        Impotence    Patient still have problem with impotence.  He has been called a prescription for sildenafil, he need to  look for a coupon for Viagra or sildenafil if it is too expensive for him         No orders of the defined types were placed in this encounter.   Follow-up: No follow-ups on file.  Cletis [place], MD

## 2020-06-25 NOTE — Assessment & Plan Note (Signed)
Asthma is under control on inhalers.  Patient does not smoke.  He denies any history of any sinus problem postnasal drip.

## 2020-06-25 NOTE — Assessment & Plan Note (Signed)
Patient was advised to continue taking Claritin 5 mg p.o. daily allergic rhinitis.

## 2020-06-25 NOTE — Assessment & Plan Note (Signed)
-   I encouraged the patient to lose weight.  - I educated them on making healthy dietary choices including eating more fruits and vegetables and less fried foods. - I encouraged the patient to exercise more, and educated on the benefits of exercise including weight loss, diabetes prevention, and hypertension prevention.   

## 2020-06-25 NOTE — Assessment & Plan Note (Signed)
Patient still have problem with impotence.  He has been called a prescription for sildenafil, he need to look for a coupon for Viagra or sildenafil if it is too expensive for him

## 2020-07-16 DIAGNOSIS — Z03818 Encounter for observation for suspected exposure to other biological agents ruled out: Secondary | ICD-10-CM | POA: Diagnosis not present

## 2020-07-16 DIAGNOSIS — Z20822 Contact with and (suspected) exposure to covid-19: Secondary | ICD-10-CM | POA: Diagnosis not present

## 2020-09-17 DIAGNOSIS — Z20822 Contact with and (suspected) exposure to covid-19: Secondary | ICD-10-CM | POA: Diagnosis not present

## 2020-09-17 DIAGNOSIS — Z03818 Encounter for observation for suspected exposure to other biological agents ruled out: Secondary | ICD-10-CM | POA: Diagnosis not present

## 2020-09-24 ENCOUNTER — Ambulatory Visit: Payer: BC Managed Care – PPO | Admitting: Internal Medicine

## 2021-04-04 ENCOUNTER — Other Ambulatory Visit: Payer: Self-pay | Admitting: Internal Medicine

## 2021-04-04 DIAGNOSIS — N529 Male erectile dysfunction, unspecified: Secondary | ICD-10-CM

## 2021-08-04 ENCOUNTER — Encounter: Payer: Self-pay | Admitting: Emergency Medicine

## 2021-08-04 ENCOUNTER — Ambulatory Visit
Admission: EM | Admit: 2021-08-04 | Discharge: 2021-08-04 | Disposition: A | Discharge status: Home / Self Care | Payer: BC Managed Care – PPO

## 2021-08-04 DIAGNOSIS — R1084 Generalized abdominal pain: Secondary | ICD-10-CM

## 2021-08-04 MED ORDER — FAMOTIDINE 20 MG PO TABS: 20.0000 mg | ORAL_TABLET | Freq: Two times a day (BID) | ORAL | 0 refills | Status: DC | PRN

## 2021-08-04 NOTE — ED Triage Notes (Signed)
Pt presents with abdominal pain since yesterday. Pt states when the pain comes on he feels like he has to have a BM. Last BM was yesterday and he states it was liquid.

## 2021-08-04 NOTE — Discharge Instructions (Addendum)
Recommend famotidine and GI rest for the next 24 hours. Avoid diary. Hydrate well with clear liquids. If you stomach pain worsens or becomes more severe, go immediately to the Emergency Department

## 2021-08-04 NOTE — ED Provider Notes (Signed)
Karl Johnson    CSN: [id number] Arrival date & time: 08/04/21  0907      History   Chief Complaint Chief Complaint  Patient presents with   Abdominal Pain    HPI Karl Johnson is a 31 y.o. male.   HPI Patient presents today with nonspecific generalized abdominal pain  X one day. He denies any associated nausea or vomiting.  He had one bowel movement today which she reports was watery and the color of the stool was consistent with previous bowel movements. He has been eating regularly and had a chicken salad sandwich this morning.  He continues to complain that the pain is unrelated.  He is not experiencing any bloating.  Any abdominal distention.  No other associated symptoms.  History reviewed. No pertinent past medical history.  Patient Active Problem List   Diagnosis Date Noted   Metabolic syndrome [id number]   Seasonal allergic rhinitis due to pollen 03/01/2020   Mild intermittent asthma without complication [id number]   Annual physical exam 03/01/2020   Impotence 03/01/2020    History reviewed. No pertinent surgical history.     Home Medications    Prior to Admission medications   Medication Sig Start Date End Date Taking? Authorizing Provider  fluticasone (FLONASE) 50 MCG/ACT nasal spray Place 2 sprays into both nostrils daily. 03/01/20   Cletis Athens, MD  fluticasone furoate-vilanterol (BREO ELLIPTA) 100-25 MCG/INH AEPB Inhale 1 puff into the lungs in the morning and at bedtime. Patient not taking: Reported on 06/26/2020 03/02/20   Cletis Athens, MD  Fluticasone-Salmeterol (ADVAIR DISKUS) 250-50 MCG/DOSE AEPB Inhale 1 puff into the lungs 2 (two) times daily. Patient not taking: Reported on 06/26/2020 03/09/20   Cletis Athens, MD  loratadine (CLARITIN) 10 MG tablet Take 10 mg by mouth daily.    [provider]  loratadine (CLARITIN) 10 MG tablet Take by mouth.    [provider]  Multiple Vitamin (MULTI-VITAMIN) tablet Take 1 tablet by  mouth daily.    [provider]  tadalafil (CIALIS) 20 MG tablet TAKE 1/2 TO 1 TABLET(10-20 MG TOTAL) BY MOUTH EVERY OTHER DAY AS NEEDED FOR ERECTILE DYSFUNCTION. 04/05/21   Cletis Athens, MD  chlorpheniramine (CHLOR-TRIMETON) 4 MG tablet Take 1 tablet (4 mg total) by mouth 2 (two) times daily as needed for allergies or rhinitis. Patient not taking: Reported on 11/30/2017 06/19/15 08/20/19  Beers, Pierce Crane, PA-C  famotidine (PEPCID) 20 MG tablet Take 1 tablet (20 mg total) by mouth 2 (two) times daily. 12/10/17 12/05/19  Carrie Mew, MD    Family History Family History  Problem Relation Age of Onset   Healthy Mother    Healthy Father    Hypertension Maternal Grandmother    Hypertension Maternal Grandfather     Social History Social History   Tobacco Use   Smoking status: Former   Smokeless tobacco: Never  Scientific laboratory technician Use: Never used  Substance Use Topics   Alcohol use: No   Drug use: Never     Allergies   Other   Review of Systems Review of Systems Pertinent negatives listed in HPI   Physical Exam Triage Vital Signs ED Triage Vitals  Enc Vitals Group     BP 08/04/21 0916 109/79     Pulse Rate 08/04/21 0916 81     Resp 08/04/21 0916 18     Temp 08/04/21 0916 98.6 F (37 C)     Temp Source 08/04/21 0916 Oral     SpO2  08/04/21 0916 98 %     Weight --      Height --      Head Circumference --      Peak Flow --      Pain Score 08/04/21 0917 5     Pain Loc --      Pain Edu? --      Excl. in [place]? --    No data found.  Updated Vital Signs BP 109/79 (BP Location: Left Arm)    Pulse 81    Temp 98.6 F (37 C) (Oral)    Resp 18    SpO2 98%   Visual Acuity Right Eye Distance:   Left Eye Distance:   Bilateral Distance:    Right Eye Near:   Left Eye Near:    Bilateral Near:     Physical Exam Constitutional:      General: He is not in acute distress.    Appearance: He is well-developed. He is not ill-appearing or toxic-appearing.  HENT:      Head: Normocephalic and atraumatic.  Cardiovascular:     Rate and Rhythm: Normal rate and regular rhythm.  Pulmonary:     Effort: Pulmonary effort is normal.     Breath sounds: Normal breath sounds.  Abdominal:     General: Bowel sounds are increased.     Palpations: Abdomen is soft.     Tenderness: There is no abdominal tenderness.     Comments: No reproducible abdominal pain or palpable mass noted  on exam   Skin:    General: Skin is warm.     Capillary Refill: Capillary refill takes less than 2 seconds.  Neurological:     General: No focal deficit present.     Mental Status: He is alert.  Psychiatric:        Mood and Affect: Mood normal.        Behavior: Behavior normal.     UC Treatments / Results  Labs (all labs ordered are listed, but only abnormal results are displayed) Labs Reviewed - No data to display  EKG   Radiology No results found.  Procedures Procedures (including critical care time)  Medications Ordered in UC Medications - No data to display  Initial Impression / Assessment and Plan / UC Course  I have reviewed the triage vital signs and the nursing notes.  Pertinent labs & imaging results that were available during my care of the patient were reviewed by me and considered in my medical decision making (see chart for details).    Generalized nonspecific abdominal pain Patient appears clinically well and was absent of any distress or guarding type symptoms during GI exam. Suspect possible evolving viral gastro illness. Vital signs are stable. Recommend and prescribed a short course of famotidine.  Red flag precautions given if abdominal pain worsens or become severe go immediately to the emergency department. Follow-up with primary care provider as needed or return here as needed. Final Clinical Impressions(s) / UC Diagnoses   Final diagnoses:  Generalized abdominal pain   Discharge Instructions   None    ED Prescriptions     Medication Sig  Dispense Auth. Provider   famotidine (PEPCID) 20 MG tablet Take 1 tablet (20 mg total) by mouth 2 (two) times daily as needed for heartburn or indigestion. 20 tablet Scot Jun, FNP      PDMP not reviewed this encounter.   Scot Jun, [place] 08/04/21 [phone]

## 2021-09-20 ENCOUNTER — Ambulatory Visit
Admission: EM | Admit: 2021-09-20 | Discharge: 2021-09-20 | Disposition: A | Discharge status: Home / Self Care | Payer: Self-pay | Attending: Emergency Medicine | Admitting: Emergency Medicine

## 2021-09-20 ENCOUNTER — Encounter: Payer: Self-pay | Admitting: Emergency Medicine

## 2021-09-20 DIAGNOSIS — R051 Acute cough: Secondary | ICD-10-CM

## 2021-09-20 DIAGNOSIS — J0100 Acute maxillary sinusitis, unspecified: Secondary | ICD-10-CM

## 2021-09-20 MED ORDER — BENZONATATE 100 MG PO CAPS: 100.0000 mg | ORAL_CAPSULE | Freq: Three times a day (TID) | ORAL | 0 refills | Status: DC | PRN

## 2021-09-20 MED ORDER — AMOXICILLIN 875 MG PO TABS: 875.0000 mg | ORAL_TABLET | Freq: Two times a day (BID) | ORAL | 0 refills | Status: AC

## 2021-09-20 NOTE — ED Provider Notes (Signed)
?[facility] ? ? ? ?CSN: [id number] ?Arrival date & time: 09/20/21  1920 ? ? ?  ? ?History   ?Chief Complaint ?Chief Complaint  ?Patient presents with  ? Cough  ? Headache  ? Nasal Congestion  ? ? ?HPI ?Karl Johnson is a 31 y.o. male.  Patient presents with 1 week history of sinus congestion, sinus headache, sinus pressure, postnasal drip, nonproductive cough.  He denies fever, chills, wheezing, shortness of breath, or other symptoms.  Several OTC treatments attempted without relief.  Patient was seen at this urgent care on 08/04/2021; diagnosed with generalized abdominal pain; treated with famotidine.  His medical history includes asthma, seasonal allergies, metabolic syndrome.  He states he has not required an inhaler in several years. ? ?The history is provided by the patient and medical records.  ? ?History reviewed. No pertinent past medical history. ? ?Patient Active Problem List  ? Diagnosis Date Noted  ? Metabolic syndrome 03/01/2020  ? Seasonal allergic rhinitis due to pollen 03/01/2020  ? Mild intermittent asthma without complication 03/01/2020  ? Annual physical exam 03/01/2020  ? Impotence 03/01/2020  ? ? ?History reviewed. No pertinent surgical history. ? ? ? ? ?Home Medications   ? ?Prior to Admission medications   ?Medication Sig Start Date End Date Taking? Authorizing Provider  ?amoxicillin (AMOXIL) 875 MG tablet Take 1 tablet (875 mg total) by mouth 2 (two) times daily for 7 days. 09/20/21 09/27/21 Yes Mickie Bailate, Aveya Beal H, NP  ?benzonatate (TESSALON) 100 MG capsule Take 1 capsule (100 mg total) by mouth 3 (three) times daily as needed for cough. 09/20/21  Yes Mickie Bailate, Tae Vonada H, NP  ?famotidine (PEPCID) 20 MG tablet Take 1 tablet (20 mg total) by mouth 2 (two) times daily as needed for heartburn or indigestion. 08/04/21   Bing NeighborsHarris, Kimberly S, FNP  ?fluticasone (FLONASE) 50 MCG/ACT nasal spray Place 2 sprays into both nostrils daily. 03/01/20   Corky DownsMasoud, Javed, MD  ?fluticasone furoate-vilanterol (BREO ELLIPTA)  100-25 MCG/INH AEPB Inhale 1 puff into the lungs in the morning and at bedtime. ?Patient not taking: Reported on 06/26/2020 03/02/20   Corky DownsMasoud, Javed, MD  ?Fluticasone-Salmeterol (ADVAIR DISKUS) 250-50 MCG/DOSE AEPB Inhale 1 puff into the lungs 2 (two) times daily. ?Patient not taking: Reported on 06/26/2020 03/09/20   Corky DownsMasoud, Javed, MD  ?loratadine (CLARITIN) 10 MG tablet Take 10 mg by mouth daily.    [provider]  ?loratadine (CLARITIN) 10 MG tablet Take by mouth.    [provider]  ?Multiple Vitamin (MULTI-VITAMIN) tablet Take 1 tablet by mouth daily.    [provider]  ?tadalafil (CIALIS) 20 MG tablet TAKE 1/2 TO 1 TABLET(10-20 MG TOTAL) BY MOUTH EVERY OTHER DAY AS NEEDED FOR ERECTILE DYSFUNCTION. 04/05/21   Corky DownsMasoud, Javed, MD  ?chlorpheniramine (CHLOR-TRIMETON) 4 MG tablet Take 1 tablet (4 mg total) by mouth 2 (two) times daily as needed for allergies or rhinitis. ?Patient not taking: Reported on 11/30/2017 06/19/15 08/20/19  Evangeline DakinBeers, Charles M, PA-C  ? ? ?Family History ?Family History  ?Problem Relation Age of Onset  ? Healthy Mother   ? Healthy Father   ? Hypertension Maternal Grandmother   ? Hypertension Maternal Grandfather   ? ? ?Social History ?Social History  ? ?Tobacco Use  ? Smoking status: Former  ? Smokeless tobacco: Never  ?Vaping Use  ? Vaping Use: Never used  ?Substance Use Topics  ? Alcohol use: No  ? Drug use: Never  ? ? ? ?Allergies   ?Other ? ? ?Review  of Systems ?Review of Systems  ?Constitutional:  Negative for chills and fever.  ?HENT:  Positive for congestion, postnasal drip, sinus pressure and sinus pain. Negative for ear pain and sore throat.   ?Respiratory:  Positive for cough. Negative for shortness of breath and wheezing.   ?Cardiovascular:  Negative for chest pain and palpitations.  ?Gastrointestinal:  Negative for diarrhea and vomiting.  ?Skin:  Negative for color change and rash.  ?All other systems reviewed and are negative. ? ? ?Physical Exam ?Triage Vital  Signs ?ED Triage Vitals [09/20/21 1921]  ?Enc Vitals Group  ?   BP   ?   Pulse   ?   Resp   ?   Temp   ?   Temp src   ?   SpO2   ?   Weight   ?   Height   ?   Head Circumference   ?   Peak Flow   ?   Pain Score 3  ?   Pain Loc   ?   Pain Edu?   ?   Excl. in [place]?   ? ?No data found. ? ?Updated Vital Signs ?BP 120/81   Pulse 71   Temp 98.2 ?F (36.8 ?C)   Resp 18   SpO2 97%  ? ?Visual Acuity ?Right Eye Distance:   ?Left Eye Distance:   ?Bilateral Distance:   ? ?Right Eye Near:   ?Left Eye Near:    ?Bilateral Near:    ? ?Physical Exam ?Vitals and nursing note reviewed.  ?Constitutional:   ?   General: He is not in acute distress. ?   Appearance: Normal appearance. He is well-developed. He is not ill-appearing.  ?HENT:  ?   Right Ear: Tympanic membrane normal.  ?   Left Ear: Tympanic membrane normal.  ?   Nose: Congestion present.  ?   Mouth/Throat:  ?   Mouth: Mucous membranes are moist.  ?   Pharynx: Oropharynx is clear.  ?Cardiovascular:  ?   Rate and Rhythm: Normal rate and regular rhythm.  ?   Heart sounds: Normal heart sounds.  ?Pulmonary:  ?   Effort: Pulmonary effort is normal. No respiratory distress.  ?   Breath sounds: Normal breath sounds. No wheezing.  ?Musculoskeletal:  ?   Cervical back: Neck supple.  ?Skin: ?   General: Skin is warm and dry.  ?   Capillary Refill: Capillary refill takes less than 2 seconds.  ?Neurological:  ?   Mental Status: He is alert.  ?Psychiatric:     ?   Mood and Affect: Mood normal.     ?   Behavior: Behavior normal.  ? ? ? ?UC Treatments / Results  ?Labs ?(all labs ordered are listed, but only abnormal results are displayed) ?Labs Reviewed - No data to display ? ?EKG ? ? ?Radiology ?No results found. ? ?Procedures ?Procedures (including critical care time) ? ?Medications Ordered in UC ?Medications - No data to display ? ?Initial Impression / Assessment and Plan / UC Course  ?I have reviewed the triage vital signs and the nursing notes. ? ?Pertinent labs & imaging results that  were available during my care of the patient were reviewed by me and considered in my medical decision making (see chart for details). ? ?Acute sinusitis, cough.  Patient has been symptomatic for 1 week and is not improving despite multiple OTC treatments attempted.  Treating with amoxicillin and Tessalon Perles.  Patient declines prednisone or albuterol inhaler today;  he has no wheezing or shortness of breath and does not feel this is asthma related.  Instructed him to follow-up with his PCP if his symptoms are not improving.  He agrees to plan of care. ? ? ?Final Clinical Impressions(s) / UC Diagnoses  ? ?Final diagnoses:  ?Acute non-recurrent maxillary sinusitis  ?Acute cough  ? ? ? ?Discharge Instructions   ? ?  ?Take the amoxicillin and Tessalon Perles as directed.  Follow up with your primary care provider if your symptoms are not improving.   ? ? ? ? ? ?ED Prescriptions   ? ? Medication Sig Dispense Auth. Provider  ? amoxicillin (AMOXIL) 875 MG tablet Take 1 tablet (875 mg total) by mouth 2 (two) times daily for 7 days. 14 tablet Mickie Bail, NP  ? benzonatate (TESSALON) 100 MG capsule Take 1 capsule (100 mg total) by mouth 3 (three) times daily as needed for cough. 21 capsule Mickie Bail, NP  ? ?  ? ?PDMP not reviewed this encounter. ?  ?Mickie Bail, NP ?09/20/21 1943 ? ?

## 2021-09-20 NOTE — Discharge Instructions (Addendum)
Take the amoxicillin and Tessalon Perles as directed.    Follow up with your primary care provider if your symptoms are not improving.    

## 2021-09-20 NOTE — ED Triage Notes (Signed)
Pt here with cough, congestion and sinus headache x 1 week.  ?

## 2022-07-23 NOTE — Progress Notes (Unsigned)
  Tomasita Morrow, NP-C Phone: [phone]  Karl Johnson is a 32 y.o. male who presents today to establish care.   ***  Social History   Tobacco Use  Smoking Status Former  Smokeless Tobacco Never    Current Outpatient Medications on File Prior to Visit  Medication Sig Dispense Refill   benzonatate (TESSALON) 100 MG capsule Take 1 capsule (100 mg total) by mouth 3 (three) times daily as needed for cough. 21 capsule 0   famotidine (PEPCID) 20 MG tablet Take 1 tablet (20 mg total) by mouth 2 (two) times daily as needed for heartburn or indigestion. 20 tablet 0   fluticasone (FLONASE) 50 MCG/ACT nasal spray Place 2 sprays into both nostrils daily. 16 g 6   fluticasone furoate-vilanterol (BREO ELLIPTA) 100-25 MCG/INH AEPB Inhale 1 puff into the lungs in the morning and at bedtime. (Patient not taking: Reported on 06/26/2020) 60 each 4   Fluticasone-Salmeterol (ADVAIR DISKUS) 250-50 MCG/DOSE AEPB Inhale 1 puff into the lungs 2 (two) times daily. (Patient not taking: Reported on 06/26/2020) 1 each 3   loratadine (CLARITIN) 10 MG tablet Take 10 mg by mouth daily.     loratadine (CLARITIN) 10 MG tablet Take by mouth.     Multiple Vitamin (MULTI-VITAMIN) tablet Take 1 tablet by mouth daily.     tadalafil (CIALIS) 20 MG tablet TAKE 1/2 TO 1 TABLET(10-20 MG TOTAL) BY MOUTH EVERY OTHER DAY AS NEEDED FOR ERECTILE DYSFUNCTION. 5 tablet 11   [DISCONTINUED] chlorpheniramine (CHLOR-TRIMETON) 4 MG tablet Take 1 tablet (4 mg total) by mouth 2 (two) times daily as needed for allergies or rhinitis. (Patient not taking: Reported on 11/30/2017) 30 tablet 0   No current facility-administered medications on file prior to visit.     ROS see history of present illness  Objective  Physical Exam There were no vitals filed for this visit.  BP Readings from Last 3 Encounters:  09/20/21 120/81  08/04/21 109/79  06/26/20 124/75   Wt Readings from Last 3 Encounters:  06/26/20 235 lb 6.4 oz (106.8 kg)  03/09/20  237 lb 9.6 oz (107.8 kg)  03/01/20 238 lb 6.4 oz (108.1 kg)    Physical Exam   Assessment/Plan: Please see individual problem list.  There are no diagnoses linked to this encounter.   Health Maintenance: ***  No follow-ups on file.   Tomasita Morrow, NP-C [place]

## 2022-07-24 ENCOUNTER — Encounter: Payer: Self-pay | Admitting: Nurse Practitioner

## 2022-07-24 ENCOUNTER — Ambulatory Visit (INDEPENDENT_AMBULATORY_CARE_PROVIDER_SITE_OTHER): Payer: Commercial Managed Care - PPO | Admitting: Nurse Practitioner

## 2022-07-24 VITALS — BP 118/82 | HR 75 | Temp 98.0°F | Ht 73.0 in | Wt 232.4 lb

## 2022-07-24 DIAGNOSIS — Z0000 Encounter for general adult medical examination without abnormal findings: Secondary | ICD-10-CM | POA: Diagnosis not present

## 2022-07-24 DIAGNOSIS — J301 Allergic rhinitis due to pollen: Secondary | ICD-10-CM | POA: Diagnosis not present

## 2022-07-24 DIAGNOSIS — Z1159 Encounter for screening for other viral diseases: Secondary | ICD-10-CM

## 2022-07-24 DIAGNOSIS — Z114 Encounter for screening for human immunodeficiency virus [HIV]: Secondary | ICD-10-CM

## 2022-07-24 DIAGNOSIS — K219 Gastro-esophageal reflux disease without esophagitis: Secondary | ICD-10-CM | POA: Diagnosis not present

## 2022-07-24 DIAGNOSIS — Z0001 Encounter for general adult medical examination with abnormal findings: Secondary | ICD-10-CM

## 2022-07-24 DIAGNOSIS — K529 Noninfective gastroenteritis and colitis, unspecified: Secondary | ICD-10-CM | POA: Insufficient documentation

## 2022-07-24 DIAGNOSIS — J4520 Mild intermittent asthma, uncomplicated: Secondary | ICD-10-CM

## 2022-07-24 MED ORDER — FAMOTIDINE 20 MG PO TABS: 20.0000 mg | ORAL_TABLET | Freq: Every day | ORAL | 3 refills | Status: AC

## 2022-07-24 MED ORDER — FLUTICASONE PROPIONATE 50 MCG/ACT NA SUSP: 2.0000 | Freq: Every day | NASAL | 5 refills | Status: AC

## 2022-07-24 NOTE — Assessment & Plan Note (Signed)
Stable on Claritin and Flonase. Refills for Flonase sent. Symptoms well managed at this time. Only taking Claritin PRN during season changes.

## 2022-07-24 NOTE — Assessment & Plan Note (Addendum)
Physical exam complete. Lab work as outlined. Will contact patient with results. Vaccines UTD. Encouraged healthy diet and exercise.

## 2022-07-24 NOTE — Assessment & Plan Note (Signed)
Based on patient symptoms, concern for gluten intolerance. Lab work as outlined, will contact patient with results. Advised to avoid all foods containing gluten to see if he gets symptom relief. Consider referral to GI if symptoms persist and lab work is normal.

## 2022-07-24 NOTE — Patient Instructions (Signed)
It was nice to meet you!   I have sent in refills for your Pepcid and Flonase.  I will contact you with your labs results.

## 2022-07-24 NOTE — Assessment & Plan Note (Addendum)
Patient taking medication PRN, has been having worsening symptoms. Advised to take Pepcid daily. Refills sent. Counseled on not lying down after eating. Dietary information provided. Advised to avoid spicy and acidic foods, monitor for triggers.

## 2022-07-24 NOTE — Assessment & Plan Note (Deleted)
Will check celiac panel 

## 2022-07-25 LAB — COMPREHENSIVE METABOLIC PANEL
ALT: 17 U/L (ref 0–53)
AST: 14 U/L (ref 0–37)
Albumin: 4.4 g/dL (ref 3.5–5.2)
Alkaline Phosphatase: 103 U/L (ref 39–117)
BUN: 13 mg/dL (ref 6–23)
CO2: 24 mEq/L (ref 19–32)
Calcium: 9.5 mg/dL (ref 8.4–10.5)
Chloride: 104 mEq/L (ref 96–112)
Creatinine, Ser: 1.3 mg/dL (ref 0.40–1.50)
GFR: 73.41 mL/min (ref >60.00)
Glucose, Bld: 89 mg/dL (ref 70–99)
Potassium: 4.1 mEq/L (ref 3.5–5.1)
Sodium: 140 mEq/L (ref 135–145)
Total Bilirubin: 0.5 mg/dL (ref 0.2–1.2)
Total Protein: 6.7 g/dL (ref 6.0–8.3)

## 2022-07-25 LAB — LIPID PANEL
Cholesterol: 135 mg/dL (ref 0–200)
HDL: 28.2 mg/dL — ABNORMAL LOW (ref >39.00)
NonHDL: 106.5
Total CHOL/HDL Ratio: 5
Triglycerides: 242 mg/dL — ABNORMAL HIGH (ref 0.0–149.0)
VLDL: 48.4 mg/dL — ABNORMAL HIGH (ref 0.0–40.0)

## 2022-07-25 LAB — CBC WITH DIFFERENTIAL/PLATELET
Basophils Absolute: 0 10*3/uL (ref 0.0–0.1)
Basophils Relative: 0.6 % (ref 0.0–3.0)
Eosinophils Absolute: 0.4 10*3/uL (ref 0.0–0.7)
Eosinophils Relative: 4.5 % (ref 0.0–5.0)
HCT: 48.8 % (ref 39.0–52.0)
Hemoglobin: 16.6 g/dL (ref 13.0–17.0)
Lymphocytes Relative: 18.7 % (ref 12.0–46.0)
Lymphs Abs: 1.6 10*3/uL (ref 0.7–4.0)
MCHC: 34 g/dL (ref 30.0–36.0)
MCV: 84.3 fl (ref 78.0–100.0)
Monocytes Absolute: 0.4 10*3/uL (ref 0.1–1.0)
Monocytes Relative: 5.1 % (ref 3.0–12.0)
Neutro Abs: 6 10*3/uL (ref 1.4–7.7)
Neutrophils Relative %: 71.1 % (ref 43.0–77.0)
Platelets: 409 10*3/uL — ABNORMAL HIGH (ref 150.0–400.0)
RBC: 5.79 Mil/uL (ref 4.22–5.81)
RDW: 13.1 % (ref 11.5–15.5)
WBC: 8.4 10*3/uL (ref 4.0–10.5)

## 2022-07-25 LAB — HIV ANTIBODY (ROUTINE TESTING W REFLEX): HIV 1&2 Ab, 4th Generation: NONREACTIVE

## 2022-07-25 LAB — LDL CHOLESTEROL, DIRECT: Direct LDL: 81 mg/dL

## 2022-07-25 LAB — TSH: TSH: 0.91 u[IU]/mL (ref 0.35–5.50)

## 2022-07-25 LAB — GLIADIN ANTIBODIES, SERUM
Gliadin IgA: 3.1 U/mL
Gliadin IgG: <1 U/mL

## 2022-07-25 LAB — HEMOGLOBIN A1C: Hgb A1c MFr Bld: 5.4 % (ref 4.6–6.5)

## 2022-07-25 LAB — HEPATITIS C ANTIBODY: Hepatitis C Ab: NONREACTIVE

## 2022-07-25 LAB — TISSUE TRANSGLUTAMINASE, IGA: (tTG) Ab, IgA: <1 U/mL

## 2022-07-26 LAB — RETICULIN ANTIBODIES, IGA W TITER: Reticulin Ab, IgA: NEGATIVE titer (ref <2.5)

## 2022-07-29 ENCOUNTER — Encounter: Payer: Self-pay | Admitting: Nurse Practitioner

## 2022-08-31 ENCOUNTER — Encounter (HOSPITAL_COMMUNITY): Payer: Self-pay

## 2022-08-31 ENCOUNTER — Other Ambulatory Visit: Payer: Self-pay

## 2022-08-31 ENCOUNTER — Emergency Department (HOSPITAL_COMMUNITY)
Admission: EM | Admit: 2022-08-31 | Discharge: 2022-08-31 | Disposition: A | Discharge status: Home / Self Care | Payer: Commercial Managed Care - PPO | Attending: Emergency Medicine | Admitting: Emergency Medicine

## 2022-08-31 ENCOUNTER — Emergency Department (HOSPITAL_COMMUNITY): Payer: Commercial Managed Care - PPO

## 2022-08-31 DIAGNOSIS — X509XXA Other and unspecified overexertion or strenuous movements or postures, initial encounter: Secondary | ICD-10-CM | POA: Insufficient documentation

## 2022-08-31 DIAGNOSIS — Y9368 Activity, volleyball (beach) (court): Secondary | ICD-10-CM | POA: Insufficient documentation

## 2022-08-31 DIAGNOSIS — M25571 Pain in right ankle and joints of right foot: Secondary | ICD-10-CM | POA: Diagnosis present

## 2022-08-31 HISTORY — DX: Gastro-esophageal reflux disease without esophagitis: K21.9

## 2022-08-31 MED ORDER — IBUPROFEN 800 MG PO TABS
800.0000 mg | ORAL_TABLET | Freq: Once | ORAL | Status: AC
  Administered 2022-08-31: 800 mg via ORAL
  Filled 2022-08-31: qty 1

## 2022-08-31 MED ORDER — IBUPROFEN 800 MG PO TABS: 800.0000 mg | ORAL_TABLET | Freq: Three times a day (TID) | ORAL | 0 refills | Status: DC | PRN

## 2022-08-31 NOTE — ED Notes (Signed)
Portable xray at bedside.

## 2022-08-31 NOTE — Discharge Instructions (Signed)
Ice to area of swelling,  Elevate

## 2022-08-31 NOTE — ED Triage Notes (Signed)
Pt was playing volleyball tonight and twisted his ankle. Is not able to put weight on it.

## 2022-08-31 NOTE — ED Provider Notes (Signed)
[facility] Provider Note   CSN: [id number] Arrival date & time: 08/31/22  2004     History  Chief Complaint  Patient presents with   Ankle Pain    Karl Johnson is a 32 y.o. male.  Pt reports he turned his ankle while playing volleyball,    The history is provided by the patient. No language interpreter was used.  Ankle Pain Location:  Ankle Time since incident:  4 hours Injury: no   Ankle location:  R ankle Pain details:    Quality:  Aching   Radiates to:  Does not radiate   Severity:  Moderate   Onset quality:  Gradual   Duration:  4 hours   Timing:  Constant   Progression:  Worsening Chronicity:  New Dislocation: no   Relieved by:  Nothing Worsened by:  Nothing Ineffective treatments:  None tried Risk factors: no concern for non-accidental trauma        Home Medications Prior to Admission medications   Medication Sig Start Date End Date Taking? Authorizing Provider  ibuprofen (ADVIL) 800 MG tablet Take 1 tablet (800 mg total) by mouth every 8 (eight) hours as needed. 08/31/22  Yes Caryl Ada K, PA-C  famotidine (PEPCID) 20 MG tablet Take 1 tablet (20 mg total) by mouth daily. 07/24/22   Tomasita Morrow, NP  fluticasone (FLONASE) 50 MCG/ACT nasal spray Place 2 sprays into both nostrils daily. 07/24/22   Tomasita Morrow, NP  loratadine (CLARITIN) 10 MG tablet Take 10 mg by mouth daily.    [provider]  Multiple Vitamin (MULTI-VITAMIN) tablet Take 1 tablet by mouth daily.    [provider]  chlorpheniramine (CHLOR-TRIMETON) 4 MG tablet Take 1 tablet (4 mg total) by mouth 2 (two) times daily as needed for allergies or rhinitis. Patient not taking: Reported on 11/30/2017 06/19/15 08/20/19  Arlyss Repress, PA-C      Allergies    Other    Review of Systems   Review of Systems  All other systems reviewed and are negative.   Physical Exam Updated Vital Signs BP 123/71 (BP Location: Right Arm)   Pulse  83   Temp 98.5 F (36.9 C) (Oral)   Resp 18   Ht 6\' 1"  (1.854 m)   Wt 106.6 kg   SpO2 97%   BMI 31.00 kg/m  Physical Exam Vitals and nursing note reviewed.  Constitutional:      Appearance: He is well-developed.  HENT:     Head: Normocephalic.  Cardiovascular:     Rate and Rhythm: Normal rate.     Pulses: Normal pulses.  Pulmonary:     Effort: Pulmonary effort is normal.  Abdominal:     General: There is no distension.  Musculoskeletal:        General: Swelling and tenderness present.     Cervical back: Normal range of motion.     Comments: Swollen right lateral malleolus,  decreased range of motion,  nv and ns intact   Skin:    General: Skin is warm.  Neurological:     Mental Status: He is alert and oriented to person, place, and time.     ED Results / Procedures / Treatments   Labs (all labs ordered are listed, but only abnormal results are displayed) Labs Reviewed - No data to display  EKG None  Radiology DG Foot Complete Right  Result Date: 08/31/2022 CLINICAL DATA:  Right foot injury EXAM: RIGHT FOOT COMPLETE -  3+ VIEW COMPARISON:  None Available. FINDINGS: There is no evidence of fracture or dislocation. There is no evidence of arthropathy or other focal bone abnormality. Extensive soft tissue swelling superficial to the lateral malleolus. IMPRESSION: 1. Lateral soft tissue swelling. No fracture or dislocation. Electronically Signed   By: Fidela Salisbury M.D.   On: 08/31/2022 21:39   DG Ankle Complete Right  Result Date: 08/31/2022 CLINICAL DATA:  Right ankle injury EXAM: RIGHT ANKLE - COMPLETE 3+ VIEW COMPARISON:  None Available. FINDINGS: Normal alignment. No acute fracture or dislocation. Ankle mortise is aligned. No ankle effusion. Moderate soft tissue swelling superficial to the lateral malleolus. IMPRESSION: 1. Lateral soft tissue swelling. No fracture or dislocation. Electronically Signed   By: Fidela Salisbury M.D.   On: 08/31/2022 21:38     Procedures Procedures    Medications Ordered in ED Medications  ibuprofen (ADVIL) tablet 800 mg (has no administration in time range)    ED Course/ Medical Decision Making/ A&P                             Medical Decision Making Pt complains of turning his right ankle.    Amount and/or Complexity of Data Reviewed Independent Historian: parent Radiology: ordered and independent interpretation performed. Decision-making details documented in ED Course.    Details: Xray  shows lateral soft tissue swelling    Risk Prescription drug management. Risk Details: Pt counseled on xrays.  Pt advised to follow up with Orthopaedist for recheck this week.             Final Clinical Impression(s) / ED Diagnoses Final diagnoses:  Acute right ankle pain    Rx / DC Orders ED Discharge Orders          Ordered    ibuprofen (ADVIL) 800 MG tablet  Every 8 hours PRN        08/31/22 2210           An After Visit Summary was printed and given to the patient.    Sidney Ace 08/31/22 2218    Noemi Chapel, MD 09/01/22 1239

## 2022-08-31 NOTE — ED Notes (Signed)
ASO lace-up ankle brace applied to right ankle. Pt tolerated well. +PMS distal to brace.  Reviewed crutch instructions w/pt. Pt demonstrated understanding.

## 2022-09-01 ENCOUNTER — Telehealth: Payer: Self-pay

## 2022-09-01 NOTE — Transitions of Care (Post Inpatient/ED Visit) (Signed)
   09/01/2022  Name: Karl Johnson MRN: [id number] DOB: 12/01/1990  *Karl Johnson - Pt needs ibuprofen prescription sent to [facility] [address] [facility], was previously sent to a pharmacy in [place]. He also would like to know what ortho to follow up with in [place].  Today's TOC FU Call Status: Today's TOC FU Call Status:: Successful TOC FU Call Competed Unsuccessful Call (1st Attempt) Date: 09/01/22 [facility] FU Call Complete Date: 09/01/22  Transition Care Management Follow-up Telephone Call Date of Discharge: 08/31/22 Discharge Facility: [facility] Type of Discharge: Emergency Department Reason for ED Visit: Other: (Right ankle injury) How have you been since you were released from the hospital?: Same Any questions or concerns?: Yes Patient Questions/Concerns:: Pt's prescription was sent to [place] would like it sent to [place]; Would also like to know who in [place] he can follow up with in ortho. Patient Questions/Concerns Addressed: Notified Provider of Patient Questions/Concerns  Items Reviewed: Did you receive and understand the discharge instructions provided?: Yes Medications obtained and verified?: Yes (Medications Reviewed) Any new allergies since your discharge?: No Dietary orders reviewed?: NA Do you have support at home?: Yes People in Home: spouse Name of Support/Comfort Primary Source: [facility] and Equipment/Supplies: [place] Ordered?: No Any new equipment or medical supplies ordered?: No  Functional Questionnaire: Do you need assistance with bathing/showering or dressing?: No Do you need assistance with meal preparation?: No Do you need assistance with eating?: No Do you have difficulty maintaining continence: No Do you have difficulty managing or taking your medications?: No  Folllow up appointments reviewed: PCP Follow-up appointment confirmed?: NA Specialist Hospital Follow-up appointment confirmed?:  No Do you need transportation to your follow-up appointment?: No Do you understand care options if your condition(s) worsen?: Yes-patient verbalized understanding    SIGNATURE Ferne Reus, RN

## 2022-09-01 NOTE — Transitions of Care (Post Inpatient/ED Visit) (Signed)
   09/01/2022  Name: Karl Johnson MRN: [id number] DOB: 25-Sep-1990  Today's TOC FU Call Status: Today's TOC FU Call Status:: Unsuccessul Call (1st Attempt) Unsuccessful Call (1st Attempt) Date: 09/01/22  Attempted to reach the patient regarding the most recent Inpatient/ED visit.  Follow Up Plan: Additional outreach attempts will be made to reach the patient to complete the Transitions of Care (Post Inpatient/ED visit) call.   Signature  Ferne Reus, RN

## 2022-09-02 ENCOUNTER — Other Ambulatory Visit: Payer: Self-pay | Admitting: Nurse Practitioner

## 2022-09-02 ENCOUNTER — Encounter: Payer: Self-pay | Admitting: Nurse Practitioner

## 2022-09-02 DIAGNOSIS — M25571 Pain in right ankle and joints of right foot: Secondary | ICD-10-CM

## 2022-09-02 MED ORDER — IBUPROFEN 800 MG PO TABS: 800.0000 mg | ORAL_TABLET | Freq: Three times a day (TID) | ORAL | 0 refills | Status: AC | PRN

## 2022-09-02 NOTE — Telephone Encounter (Signed)
Pt called and is aware.  

## 2022-09-02 NOTE — Telephone Encounter (Signed)
Pt returned Cordelia Pen RN call. Transferred.

## 2022-09-02 NOTE — Telephone Encounter (Signed)
Pt called in staying if we can sent [facility] his images from ED? Or does he has to call the hospital for that?

## 2023-04-04 ENCOUNTER — Ambulatory Visit
Admission: RE | Admit: 2023-04-04 | Discharge: 2023-04-04 | Disposition: A | Discharge status: Home / Self Care | Payer: Commercial Managed Care - PPO | Source: Ambulatory Visit

## 2023-04-04 VITALS — BP 119/76 | HR 90 | Temp 98.6°F | Resp 18

## 2023-04-04 DIAGNOSIS — H6992 Unspecified Eustachian tube disorder, left ear: Secondary | ICD-10-CM | POA: Diagnosis not present

## 2023-04-04 DIAGNOSIS — H9202 Otalgia, left ear: Secondary | ICD-10-CM

## 2023-04-04 MED ORDER — PREDNISONE 20 MG PO TABS: 40.0000 mg | ORAL_TABLET | Freq: Every day | ORAL | 0 refills | Status: AC

## 2023-04-04 NOTE — ED Provider Notes (Signed)
Renaldo Fiddler    CSN: [id number] Arrival date & time: 04/04/23  1237      History   Chief Complaint Chief Complaint  Patient presents with   Ear Fullness    Entered by patient    HPI Karl Johnson is a 32 y.o. male.   Patient presents today with a 1 day history of left ear fullness and pain.  Reports pain is rated 3/4 and a 0-10 pain scale, described as throbbing with periodic popping, no alleviating factors identified.  Denies any fever, cough, congestion.  He does have a history of seasonal allergies but has not been taking Claritin or fluticasone regularly; uses this as needed.  Denies any recent antibiotics or steroids.  Has not tried any over-the-counter medications such as Tylenol or ibuprofen.  Denies any recent airplane travel or swimming.  He did use a Q-tip yesterday and remove some wax but generally does not put anything in his ear.    Past Medical History:  Diagnosis Date   GERD (gastroesophageal reflux disease)     Patient Active Problem List   Diagnosis Date Noted   Gastroesophageal reflux disease 07/24/2022   Chronic diarrhea 07/24/2022   Metabolic syndrome 03/01/2020   Seasonal allergic rhinitis due to pollen 03/01/2020   Preventative health care 03/01/2020   Impotence 03/01/2020    History reviewed. No pertinent surgical history.     Home Medications    Prior to Admission medications   Medication Sig Start Date End Date Taking? Authorizing Provider  omeprazole (PRILOSEC) 40 MG capsule Take 40 mg by mouth daily.   Yes [provider]  predniSONE (DELTASONE) 20 MG tablet Take 2 tablets (40 mg total) by mouth daily for 3 days. 04/04/23 04/07/23 Yes Eudell Mcphee K, PA-C  famotidine (PEPCID) 20 MG tablet Take 1 tablet (20 mg total) by mouth daily. Patient not taking: Reported on 04/04/2023 07/24/22   Bethanie Dicker, NP  fluticasone [facility]) 50 MCG/ACT nasal spray Place 2 sprays into both nostrils daily. 07/24/22   Bethanie Dicker, NP   ibuprofen (ADVIL) 800 MG tablet Take 1 tablet (800 mg total) by mouth every 8 (eight) hours as needed. 09/02/22   Bethanie Dicker, NP  loratadine (CLARITIN) 10 MG tablet Take 10 mg by mouth daily.    [provider]  Multiple Vitamin (MULTI-VITAMIN) tablet Take 1 tablet by mouth daily.    [provider]  chlorpheniramine (CHLOR-TRIMETON) 4 MG tablet Take 1 tablet (4 mg total) by mouth 2 (two) times daily as needed for allergies or rhinitis. Patient not taking: Reported on 11/30/2017 06/19/15 08/20/19  Beers, Charmayne Sheer, PA-C    Family History Family History  Problem Relation Age of Onset   Healthy Mother    Healthy Father    Lactose intolerance Daughter    Heart disease Maternal Grandmother    Depression Maternal Grandmother    Hypertension Maternal Grandmother    High Cholesterol Maternal Grandmother    High Cholesterol Maternal Grandfather    Heart disease Maternal Grandfather    Depression Maternal Grandfather    Hypertension Maternal Grandfather     Social History Social History   Tobacco Use   Smoking status: Former   Smokeless tobacco: Never  Advertising account planner   Vaping status: Never Used  Substance Use Topics   Alcohol use: Yes   Drug use: Never     Allergies   Other   Review of Systems Review of Systems  Constitutional:  Negative for activity change, appetite change, fatigue  and fever.  HENT:  Positive for ear pain. Negative for congestion, ear discharge, hearing loss, sinus pressure, sneezing and sore throat.   Respiratory:  Negative for cough and shortness of breath.   Cardiovascular:  Negative for chest pain.     Physical Exam Triage Vital Signs ED Triage Vitals  Encounter Vitals Group     BP 04/04/23 1301 119/76     Systolic BP Percentile --      Diastolic BP Percentile --      Pulse Rate 04/04/23 1301 90     Resp 04/04/23 1301 18     Temp 04/04/23 1301 98.6 F (37 C)     Temp src --      SpO2 04/04/23 1301 95 %     Weight --      Height  --      Head Circumference --      Peak Flow --      Pain Score 04/04/23 1306 3     Pain Loc --      Pain Education --      Exclude from Growth Chart --    No data found.  Updated Vital Signs BP 119/76   Pulse 90   Temp 98.6 F (37 C)   Resp 18   SpO2 95%   Visual Acuity Right Eye Distance:   Left Eye Distance:   Bilateral Distance:    Right Eye Near:   Left Eye Near:    Bilateral Near:     Physical Exam Vitals reviewed.  Constitutional:      General: He is awake.     Appearance: Normal appearance. He is well-developed. He is not ill-appearing.     Comments: Very pleasant male appears stated age in no acute distress sitting comfortably in exam room  HENT:     Head: Normocephalic and atraumatic.     Right Ear: Tympanic membrane, ear canal and external ear normal. Tympanic membrane is not erythematous or bulging.     Left Ear: Ear canal and external ear normal. Tympanic membrane is retracted. Tympanic membrane is not erythematous or bulging.     Nose: Nose normal.     Mouth/Throat:     Pharynx: Uvula midline. Posterior oropharyngeal erythema present. No oropharyngeal exudate or uvula swelling.  Cardiovascular:     Rate and Rhythm: Normal rate and regular rhythm.     Heart sounds: Normal heart sounds, S1 normal and S2 normal. No murmur heard. Pulmonary:     Effort: Pulmonary effort is normal. No accessory muscle usage or respiratory distress.     Breath sounds: Normal breath sounds. No stridor. No wheezing, rhonchi or rales.     Comments: Clear to auscultation bilaterally Abdominal:     General: Bowel sounds are normal.     Palpations: Abdomen is soft.     Tenderness: There is no abdominal tenderness.  Neurological:     Mental Status: He is alert.  Psychiatric:        Behavior: Behavior is cooperative.      UC Treatments / Results  Labs (all labs ordered are listed, but only abnormal results are displayed) Labs Reviewed - No data to  display  EKG   Radiology No results found.  Procedures Procedures (including critical care time)  Medications Ordered in UC Medications - No data to display  Initial Impression / Assessment and Plan / UC Course  I have reviewed the triage vital signs and the nursing notes.  Pertinent labs & imaging  results that were available during my care of the patient were reviewed by me and considered in my medical decision making (see chart for details).     Patient is well-appearing, afebrile, nontoxic, nontachycardic.  No evidence of acute infection on physical exam that would warrant initiation of antibiotics.  Suspect eustachian tube dysfunction as etiology of symptoms.  He was encouraged to restart his allergy medication as previously prescribed but will also use prednisone burst of 40 mg for 3 days to help manage symptoms.  Discussed that he is not to take NSAIDs with this medication due to risk of GI bleeding.  He can use Tylenol as needed for pain relief.  Discussed that if his symptoms are improving within a few days or if anything worsens he needs to be seen immediately including otorrhea, fever, nausea/vomiting interfering with oral intake, weakness, cough, congestion.  Strict return precautions given.  Final Clinical Impressions(s) / UC Diagnoses   Final diagnoses:  Acute otalgia, left  Eustachian tube dysfunction, left     Discharge Instructions      I do not see any evidence of an infection.  I suspect that you have eustachian tube dysfunction causing your symptoms.  Restart your allergy medicine as previously recommended.  Take prednisone 40 mg for 3 days.  Do not take NSAIDs with this medication due to risk of GI bleeding including aspirin, ibuprofen/Advil, naproxen/Aleve.  You can use Tylenol/acetaminophen for additional symptom relief.  If your symptoms are not improving or if anything worsens and you have drainage from the ear, worsening pain, fever, nausea, vomiting, cough,  congestion you should return for reevaluation.     ED Prescriptions     Medication Sig Dispense Auth. Provider   predniSONE (DELTASONE) 20 MG tablet Take 2 tablets (40 mg total) by mouth daily for 3 days. 6 tablet Shazia Mitchener, Noberto Retort, PA-C      PDMP not reviewed this encounter.   Jeani Hawking, PA-C 04/04/23 1322

## 2023-04-04 NOTE — Discharge Instructions (Signed)
I do not see any evidence of an infection.  I suspect that you have eustachian tube dysfunction causing your symptoms.  Restart your allergy medicine as previously recommended.  Take prednisone 40 mg for 3 days.  Do not take NSAIDs with this medication due to risk of GI bleeding including aspirin, ibuprofen/Advil, naproxen/Aleve.  You can use Tylenol/acetaminophen for additional symptom relief.  If your symptoms are not improving or if anything worsens and you have drainage from the ear, worsening pain, fever, nausea, vomiting, cough, congestion you should return for reevaluation.

## 2023-04-04 NOTE — ED Triage Notes (Signed)
Patient to Urgent Care with complaints of left sided pain/ fullness. Reports ear is throbbing and popping. Denies any drainage. No fevers.  Symptoms started yesterday afternoon. No otc meds.

## 2023-07-28 ENCOUNTER — Encounter: Payer: Commercial Managed Care - PPO | Admitting: Nurse Practitioner
